# Patient Record
Sex: Female | Born: 1985 | Race: Black or African American | Hispanic: No | Marital: Married
Health system: Midwestern US, Community
[De-identification: ages and names within clinical notes are randomized; demographics above are authoritative.]

## PROBLEM LIST (undated history)

## (undated) DIAGNOSIS — K210 Gastro-esophageal reflux disease with esophagitis: Secondary | ICD-10-CM

## (undated) DIAGNOSIS — K219 Gastro-esophageal reflux disease without esophagitis: Secondary | ICD-10-CM

---

## 2016-06-29 ENCOUNTER — Emergency Department: Admit: 2016-06-29 | Payer: MEDICAID | Primary: Adolescent Medicine

## 2016-06-29 ENCOUNTER — Inpatient Hospital Stay
Admit: 2016-06-29 | Discharge: 2016-06-29 | Disposition: A | Discharge status: Home / Self Care | Payer: MEDICAID | Attending: Emergency Medicine

## 2016-06-29 DIAGNOSIS — M25531 Pain in right wrist: Secondary | ICD-10-CM

## 2016-06-29 MED ORDER — METHOCARBAMOL 750 MG TAB
750 mg | ORAL_TABLET | Freq: Four times a day (QID) | ORAL | 0 refills | Status: DC
Start: 2016-06-29 — End: 2016-09-03

## 2016-06-29 MED ORDER — IBUPROFEN 600 MG TAB
600 mg | ORAL_TABLET | Freq: Four times a day (QID) | ORAL | 0 refills | Status: DC | PRN
Start: 2016-06-29 — End: 2016-09-20
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
## 2016-06-29 NOTE — ED Notes (Signed)
Pt  Ambulating  To  Xray  At  This  Time
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
## 2016-06-29 NOTE — ED Notes (Signed)
Discharged  Home  At  This  Time

## 2016-06-29 NOTE — ED Triage Notes (Signed)
Patient complains of right wrist and shoulder pain.

## 2016-09-03 DIAGNOSIS — B349 Viral infection, unspecified: Secondary | ICD-10-CM

## 2016-09-03 NOTE — ED Triage Notes (Signed)
Reports chills and fatigue since Sunday. States today she felt hot all over and like she was going to pass out so she called the ambulance. Reports she has a doctor appointment tomorrow but she didn't want to wait. Pt also states she did a lot of errands today and hasn't eaten.

## 2016-09-03 NOTE — ED Provider Notes (Signed)
Patient is a 31 y.o. female presenting with chills. The history is provided by the patient.   Chills    This is a new problem. The current episode started more than 2 days ago. The problem has been gradually worsening. Patient reports a subjective fever - was not measured.Associated symptoms include diarrhea and cough. Pertinent negatives include no chest pain, no sleepiness, no vomiting, no headaches, no sore throat, no shortness of breath, no neck pain and no rash. She has tried nothing for the symptoms.        Past Medical History:   Diagnosis Date   ??? Psychiatric disorder     depression       Past Surgical History:   Procedure Laterality Date   ??? HX GYN      c-section x 2         History reviewed. No pertinent family history.    Social History     Social History   ??? Marital status: MARRIED     Spouse name: N/A   ??? Number of children: N/A   ??? Years of education: N/A     Occupational History   ??? Not on file.     Social History Main Topics   ??? Smoking status: Current Every Day Smoker   ??? Smokeless tobacco: Not on file   ??? Alcohol use No   ??? Drug use: No   ??? Sexual activity: Not on file     Other Topics Concern   ??? Not on file     Social History Narrative         ALLERGIES: Clindamycin    Review of Systems   Constitutional: Positive for chills. Negative for fever.   HENT: Negative for sore throat.    Eyes: Negative for discharge, redness and itching.   Respiratory: Positive for cough. Negative for shortness of breath.    Cardiovascular: Negative for chest pain and palpitations.   Gastrointestinal: Positive for diarrhea. Negative for abdominal pain, nausea and vomiting.   Genitourinary: Negative for dysuria and hematuria.   Musculoskeletal: Negative for back pain, neck pain and neck stiffness.   Skin: Negative for pallor and rash.   Neurological: Negative for dizziness, syncope, light-headedness and headaches.       Vitals:    09/03/16 2319   BP: 128/77   Pulse: 96   Resp: 15   Temp: 97 ??F (36.1 ??C)   SpO2: 99%    Weight: 101.2 kg (223 lb)   Height: '5\' 8"'  (1.727 m)            Physical Exam   Constitutional: She is oriented to person, place, and time. She appears well-developed and well-nourished. No distress.   HENT:   Head: Normocephalic and atraumatic.   Eyes: Conjunctivae and EOM are normal. Pupils are equal, round, and reactive to light. Right eye exhibits no discharge. Left eye exhibits no discharge. No scleral icterus.   Neck: Normal range of motion. Neck supple. No JVD present. No tracheal deviation present.   Cardiovascular: Normal rate, regular rhythm, normal heart sounds and intact distal pulses.  Exam reveals no gallop and no friction rub.    No murmur heard.  Pulmonary/Chest: Effort normal and breath sounds normal. No stridor. No respiratory distress. She has no wheezes. She has no rales.   Abdominal: Soft. Bowel sounds are normal. She exhibits no distension. There is no tenderness. There is no rebound.   Musculoskeletal: Normal range of motion. She exhibits no edema or tenderness.  Lymphadenopathy:     She has no cervical adenopathy.   Neurological: She is alert and oriented to person, place, and time. She has normal reflexes. No cranial nerve deficit. She exhibits normal muscle tone. Coordination normal.   Skin: Skin is warm. No rash noted. She is not diaphoretic. No erythema. No pallor.   Psychiatric: She has a normal mood and affect. Her behavior is normal. Judgment and thought content normal.   Nursing note and vitals reviewed.       MDM      ED Course       Procedures    Recent Results (from the past 24 hour(s))   EKG, 12 LEAD, INITIAL    Collection Time: 09/03/16 11:38 PM   Result Value Ref Range    Ventricular Rate 81 BPM    Atrial Rate 81 BPM    P-R Interval 160 ms    QRS Duration 84 ms    Q-T Interval 350 ms    QTC Calculation (Bezet) 406 ms    Calculated P Axis 35 degrees    Calculated R Axis 47 degrees    Calculated T Axis 34 degrees    Diagnosis       Normal sinus rhythm  Normal ECG   No previous ECGs available     CBC WITH AUTOMATED DIFF    Collection Time: 09/03/16 11:45 PM   Result Value Ref Range    WBC 11.6 (H) 4.8 - 11.0 K/uL    RBC 4.69 4.20 - 5.40 M/uL    HGB 14.6 12.0 - 16.0 g/dL    HCT 42.2 36.0 - 48.0 %    MCV 90.0 80.0 - 100.0 FL    MCH 31.1 (H) 27.0 - 31.0 PG    MCHC 34.6 31.0 - 37.0 g/dL    RDW 13.2 11.5 - 14.0 %    PLATELET 229 130 - 400 K/uL    MPV 10.1 (H) 6.5 - 9.5 FL    NEUTROPHILS 80 (H) 42.2 - 75.2 %    LYMPHOCYTES 11 (L) 20.5 - 51.1 %    MONOCYTES 8 0 - 12.0 %    EOSINOPHILS 1 0 - 7.0 %    BASOPHILS 0 0 - 2.5 %    ABS. NEUTROPHILS 9.1 (H) 2.0 - 8.1 K/UL    ABS. LYMPHOCYTES 1.3 1.3 - 3.4 K/UL    ABS. MONOCYTES 0.9 0.1 - 1.0 K/UL    ABS. EOSINOPHILS 0.2 0.0 - 0.2 K/UL    ABS. BASOPHILS 0.0 0.0 - 0.1 K/UL    DF AUTOMATED     METABOLIC PANEL, COMPREHENSIVE    Collection Time: 09/03/16 11:45 PM   Result Value Ref Range    Sodium 140 136 - 145 mmol/L    Potassium 3.6 3.5 - 5.1 mmol/L    Chloride 103 98 - 107 mmol/L    CO2 25 21 - 32 mmol/L    Anion gap 12 4 - 12 mmol/L    Glucose 123 (H) 74 - 106 mg/dL    BUN 13 7 - 18 mg/dL    Creatinine 0.78 0.55 - 1.02 mg/dL    GFR est AA >60 >60 ml/min/1.13m    GFR est non-AA >60 >60 ml/min/1.767m   Calcium 8.5 8.5 - 10.1 mg/dL    Bilirubin, total PENDING mg/dL    ALT (SGPT) PENDING U/L    AST (SGOT) PENDING U/L    Alk. phosphatase PENDING 46 - 116 U/L    Protein, total PENDING g/dL  Albumin PENDING g/dL    Globulin PENDING g/dL    A-G Ratio PENDING     HCG QL SERUM    Collection Time: 09/03/16 11:45 PM   Result Value Ref Range    HCG, Ql. NEGATIVE  NEG     INFLUENZA A & B AG (RAPID TEST)    Collection Time: 09/03/16 11:45 PM   Result Value Ref Range    Influenza A Ag NEGATIVE  NEG      Influenza B Ag NEGATIVE  NEG      Method IMMUNOCHROMATOGRAPHIC MEMBRANE ASSAY      Comment Result: Negative for Influenza A and B     URINALYSIS W/ RFLX MICROSCOPIC    Collection Time: 09/03/16 11:50 PM   Result Value Ref Range    Color YELLOW       Appearance CLOUDY      Specific gravity >1.030     pH (UA) 6.0      Protein TRACE mg/dL    Glucose NEGATIVE  mg/dL    Ketone NEGATIVE  mg/dL    Bilirubin NEGATIVE       Blood SMALL      Urobilinogen 1.0 EU/dL    Nitrites NEGATIVE       Leukocyte Esterase NEGATIVE      URINE MICROSCOPIC    Collection Time: 09/03/16 11:50 PM   Result Value Ref Range    WBC 0-3 /hpf    RBC NONE NONE /hpf    Epithelial cells 5-10 /hpf    Bacteria FEW (A) NONE /hpf    Casts NONE NONE /lpf    Crystals, urine NONE NONE /LPF       <EMERGENCY DEPARTMENT CASE SUMMARY>    Impression/Differential Diagnosis: flu, UTI, viral syndrome    Plan: as per orders    ED Course: stable    Final Impression/Diagnosis: viral syndrome    Patient condition at time of disposition: stable for d/c home      I have reviewed the following home medications:    Prior to Admission medications    Medication Sig Start Date End Date Taking? Authorizing Provider   escitalopram oxalate (LEXAPRO) 10 mg tablet Take 10 mg by mouth daily.   Yes Phys Other, MD   nabumetone (RELAFEN) 750 mg tablet Take 750 mg by mouth two (2) times a day.   Yes Phys Other, MD   cyclobenzaprine (FLEXERIL) 5 mg tablet Take 5 mg by mouth.   Yes Phys Other, MD   ibuprofen (MOTRIN) 600 mg tablet Take 1 Tab by mouth every six (6) hours as needed for Pain. 06/29/16   Brantley Fling, PA         Rocky Crafts, MD

## 2016-09-04 ENCOUNTER — Inpatient Hospital Stay
Admit: 2016-09-04 | Discharge: 2016-09-04 | Disposition: A | Discharge status: Home / Self Care | Payer: MEDICAID | Attending: Emergency Medicine

## 2016-09-04 LAB — EKG 12-LEAD
Atrial Rate: 81 {beats}/min
Diagnosis: NORMAL
P Axis: 35 degrees
P-R Interval: 160 ms
Q-T Interval: 350 ms
QRS Duration: 84 ms
QTc Calculation (Bazett): 406 ms
R Axis: 47 degrees
T Axis: 34 degrees
Ventricular Rate: 81 {beats}/min

## 2016-09-04 LAB — URINE MICROSCOPIC

## 2016-09-04 LAB — URINALYSIS W/ RFLX MICROSCOPIC
Bilirubin: NEGATIVE
Glucose: NEGATIVE mg/dL
Ketone: NEGATIVE mg/dL
Leukocyte Esterase: NEGATIVE
Nitrites: NEGATIVE
Specific gravity: >1.03
Urobilinogen: 1 EU/dL
pH (UA): 6

## 2016-09-04 LAB — EKG, 12 LEAD, INITIAL
Atrial Rate: 81 {beats}/min
Calculated P Axis: 35 degrees
Calculated R Axis: 47 degrees
Calculated T Axis: 34 degrees
Diagnosis: NORMAL
P-R Interval: 160 ms
Q-T Interval: 350 ms
QRS Duration: 84 ms
QTC Calculation (Bezet): 406 ms
Ventricular Rate: 81 {beats}/min

## 2016-09-04 LAB — CBC WITH AUTOMATED DIFF
ABS. BASOPHILS: 0 10*3/uL (ref 0.0–0.1)
ABS. EOSINOPHILS: 0.2 10*3/uL (ref 0.0–0.2)
ABS. LYMPHOCYTES: 1.3 10*3/uL (ref 1.3–3.4)
ABS. MONOCYTES: 0.9 10*3/uL (ref 0.1–1.0)
ABS. NEUTROPHILS: 9.1 10*3/uL — ABNORMAL HIGH (ref 2.0–8.1)
BASOPHILS: 0 % (ref 0–2.5)
EOSINOPHILS: 1 % (ref 0–7.0)
HCT: 42.2 % (ref 36.0–48.0)
HGB: 14.6 g/dL (ref 12.0–16.0)
LYMPHOCYTES: 11 % — ABNORMAL LOW (ref 20.5–51.1)
MCH: 31.1 PG — ABNORMAL HIGH (ref 27.0–31.0)
MCHC: 34.6 g/dL (ref 31.0–37.0)
MCV: 90 FL (ref 80.0–100.0)
MONOCYTES: 8 % (ref 0–12.0)
MPV: 10.1 FL — ABNORMAL HIGH (ref 6.5–9.5)
NEUTROPHILS: 80 % — ABNORMAL HIGH (ref 42.2–75.2)
PLATELET: 229 10*3/uL (ref 130–400)
RBC: 4.69 M/uL (ref 4.20–5.40)
RDW: 13.2 % (ref 11.5–14.0)
WBC: 11.6 10*3/uL — ABNORMAL HIGH (ref 4.8–11.0)

## 2016-09-04 LAB — INFLUENZA A & B AG (RAPID TEST)
Comment: NEGATIVE
Influenza A Ag: NEGATIVE
Influenza B Ag: NEGATIVE

## 2016-09-04 LAB — METABOLIC PANEL, COMPREHENSIVE
A-G Ratio: 1.1 (ref 1.0–1.5)
ALT (SGPT): 46 U/L (ref 12–78)
AST (SGOT): 22 U/L (ref 15–37)
Albumin: 3.9 g/dL (ref 3.4–5.0)
Alk. phosphatase: 77 U/L (ref 46–116)
Anion gap: 12 mmol/L (ref 4–12)
BUN: 13 mg/dL (ref 7–18)
Bilirubin, total: 0.5 mg/dL (ref 0.2–1.0)
CO2: 25 mmol/L (ref 21–32)
Calcium: 8.5 mg/dL (ref 8.5–10.1)
Chloride: 103 mmol/L (ref 98–107)
Creatinine: 0.78 mg/dL (ref 0.55–1.02)
GFR est AA: >60 mL/min/{1.73_m2} (ref >60)
GFR est non-AA: >60 mL/min/{1.73_m2} (ref >60)
Globulin: 3.5 g/dL (ref 2.8–3.9)
Glucose: 123 mg/dL — ABNORMAL HIGH (ref 74–106)
Potassium: 3.6 mmol/L (ref 3.5–5.1)
Protein, total: 7.4 g/dL (ref 6.4–8.2)
Sodium: 140 mmol/L (ref 136–145)

## 2016-09-04 LAB — HCG QL SERUM: HCG, Ql.: NEGATIVE

## 2016-09-04 NOTE — ED Notes (Signed)
Discharge instructions reviewed with positive understanding noted. Ambulated from ER without complaint.

## 2016-09-08 ENCOUNTER — Inpatient Hospital Stay
Admit: 2016-09-08 | Discharge: 2016-09-08 | Disposition: A | Discharge status: Home / Self Care | Payer: MEDICAID | Attending: Emergency Medicine

## 2016-09-08 DIAGNOSIS — F418 Other specified anxiety disorders: Secondary | ICD-10-CM

## 2016-09-08 LAB — METABOLIC PANEL, COMPREHENSIVE
A-G Ratio: 1.1 (ref 1.0–1.5)
ALT (SGPT): 51 U/L (ref 12–78)
AST (SGOT): 23 U/L (ref 15–37)
Albumin: 3.8 g/dL (ref 3.4–5.0)
Alk. phosphatase: 86 U/L (ref 46–116)
Anion gap: 5 mmol/L (ref 4–12)
BUN: 9 mg/dL (ref 7–18)
Bilirubin, total: 0.3 mg/dL (ref 0.2–1.0)
CO2: 31 mmol/L (ref 21–32)
Calcium: 8.4 mg/dL — ABNORMAL LOW (ref 8.5–10.1)
Chloride: 102 mmol/L (ref 98–107)
Creatinine: 0.92 mg/dL (ref 0.55–1.02)
GFR est AA: >60 mL/min/{1.73_m2} (ref >60)
GFR est non-AA: >60 mL/min/{1.73_m2} (ref >60)
Globulin: 3.6 g/dL (ref 2.8–3.9)
Glucose: 149 mg/dL — ABNORMAL HIGH (ref 74–106)
Potassium: 3.8 mmol/L (ref 3.5–5.1)
Protein, total: 7.4 g/dL (ref 6.4–8.2)
Sodium: 138 mmol/L (ref 136–145)

## 2016-09-08 LAB — CBC WITH AUTOMATED DIFF
ABS. BASOPHILS: 0 10*3/uL (ref 0.0–0.1)
ABS. EOSINOPHILS: 0.3 10*3/uL — ABNORMAL HIGH (ref 0.0–0.2)
ABS. LYMPHOCYTES: 1.4 10*3/uL (ref 1.3–3.4)
ABS. MONOCYTES: 0.5 10*3/uL (ref 0.1–1.0)
ABS. NEUTROPHILS: 4.6 10*3/uL (ref 2.0–8.1)
BASOPHILS: 0 % (ref 0.0–2.5)
EOSINOPHILS: 4 % (ref 0.0–7.0)
HCT: 41.4 % (ref 36.0–48.0)
HGB: 14.4 g/dL (ref 12.0–16.0)
LYMPHOCYTES: 21 % (ref 20.5–51.1)
MCH: 31.4 PG — ABNORMAL HIGH (ref 27.0–31.0)
MCHC: 34.8 g/dL (ref 31.0–37.0)
MCV: 90.2 FL (ref 80.0–100.0)
MONOCYTES: 7 % (ref 0.0–12.0)
MPV: 9.4 FL (ref 6.5–9.5)
NEUTROPHILS: 68 % (ref 42.2–75.2)
PLATELET: 223 10*3/uL (ref 130–400)
RBC: 4.59 M/uL (ref 4.20–5.40)
RDW: 13.1 % (ref 11.5–14.0)
WBC: 6.8 10*3/uL (ref 4.8–11.0)

## 2016-09-08 LAB — URINALYSIS W/ RFLX MICROSCOPIC
Bilirubin: NEGATIVE
Blood: NEGATIVE
Glucose: NEGATIVE mg/dL
Ketone: NEGATIVE mg/dL
Leukocyte Esterase: NEGATIVE
Nitrites: NEGATIVE
Protein: NEGATIVE mg/dL
Specific gravity: 1.015
Urobilinogen: 1 EU/dL
pH (UA): 7.5

## 2016-09-08 LAB — DRUG SCREEN, URINE
AMPHETAMINES: NEGATIVE
BARBITURATES: NEGATIVE
BENZODIAZEPINES: NEGATIVE
COCAINE: NEGATIVE
METHADONE: NEGATIVE
Methamphetamines: NEGATIVE
OPIATES: NEGATIVE
THC (TH-CANNABINOL): POSITIVE — AB
TRICYCLICS: NEGATIVE

## 2016-09-08 LAB — ETHYL ALCOHOL: ETHYL ALCOHOL, SERUM: <5 MG/DL (ref <5)

## 2016-09-08 LAB — HCG URINE, QL: HCG urine, QL: NEGATIVE

## 2016-09-08 MED ORDER — ALPRAZOLAM 0.25 MG TAB
0.25 mg | ORAL_TABLET | Freq: Three times a day (TID) | ORAL | 0 refills | Status: DC | PRN
Start: 2016-09-08 — End: 2016-11-02

## 2016-09-08 MED ORDER — ALPRAZOLAM 0.25 MG TAB
0.25 mg | ORAL_TABLET | Freq: Three times a day (TID) | ORAL | 0 refills | Status: DC | PRN
Start: 2016-09-08 — End: 2016-09-08

## 2016-09-08 NOTE — ED Notes (Signed)
Per soc worker pt does not meet criteria for in patient care  Pt eating bag lunch at this time  Pt resting comfortably at this time

## 2016-09-08 NOTE — ED Triage Notes (Signed)
Requesting mental health eval , feeling depressed , has had a lot of life stressors , denies suicidal ideations , does follow with psych md and counselor , pt has apt feb 22 with md for possible med adjustment , needs to talk with someone

## 2016-09-08 NOTE — ED Notes (Signed)
Received pt in room 7 cooperative  Pt provided urine spec  Dr Tonia Ghenthmoud in to see pt and discussed plan of care, pt verbalized understanding  Pt verbalized the need to talk to someone and hopefully receive recommendations on where to get help   Pt states she is under a lot of stress and feeling overwhelmed  Pt states she moved to [place] about 90 days ago from [place] and just has a lot on her plate   Pt denies SI and HI  Pt cooperative

## 2016-09-08 NOTE — Progress Notes (Signed)
BEHAVIORAL HEALTH DISCHARGE INSTRUCTIONS/REFERRALS:      Toribio Harbourashawn Calder            [address]  [place] [id number]           [phone] (home)   DOB: 08-16-1985               Payor/Plan Subscr DOB Sex Relation Sub. Ins. ID Effective Group Num   1. [employer] Sonny Masters* Ligas,Kashawna 08-16-1985 Female Self [id number] 02/25/16 [place]                                   [address]       There is no problem list on file for this patient.      Patient will be discharged per Dr. Cheri RousGulati       Was the patient referred to Independent Living Peer Diversion?  Not applicable    Was the patient referred to crisis residential/respite?  Not applicable    Will [facility] contact patient for well check visit?  Not applicable    May we call you after discharge (if Yes, give the best number)?   Not applicable  May we leave a message on your phone?  Not applicable    Consents signed:  n/a    Discharge Instructions, Appointments, and Referrals:  Emergency room social worker consulted with psychiatrist, Dr. Cheri RousGulati. Per Dr. Cheri RousGulati, the patient is not meeting criteria for emergency mental health admission. Patient is denying suicidal/homicidal ideations and auditory/visual hallucinations. Per Dr. Cheri RousGulati, the patient is encouraged to follow up with her primary care physician, Dr. Lillette Boxerival on 09/11/16, along [facility]. If patient feels she needs to be reassessed for mental health between now and then, she can return to the emergency room or she call [facility] at [phone]. Mental health referrals were provided to the patient as well.       FOR RESOURCES NOT LISTED OR ADDITIONAL SUPPORTS PLEASE CALL [facility] [phone].    PATIENT ACKNOWLEDGEMENT: I hereby acknowledge that I have read the above instructions (or have had them read to me).  The instructions were explained to me and I was given the opportunity to ask questions.  I  acknowledge that I understand the instructions.  I have received a copy of these instructions.        ______________________________________   09/08/2016  Screener's Signature        ______________________________________   09/08/2016  Signature of Patient or Representative        * If you feel that you are at risk of harming yourself please call:  National Suicide Prevention Lifeline    800-273-TALK   or one of the following:    EMERGENCY NUMBERS  [facility]          [phone]  [place] [facility]                                 [phone]  [place] [facility]                            [phone]  [place] [facility]  [place]   [phone]  [facility]   [phone] Response Team  [phone]   [employer] (for teens offered Mon-Thurs 4pm-10pm   Friday 4pm-12am, Sat. 5pm -12am)  [employer] (peer to peer for youth) [phone]-[phone])

## 2016-09-08 NOTE — ED Provider Notes (Signed)
Patient is a 31 y.o. female presenting with mental health disorder. The history is provided by the patient.   Mental Health Problem   The primary symptoms include dysphoric mood. The current episode started more than 1 month ago.   The dysphoric mood began more than 2 weeks ago. The mood has been worsening since its onset. The mood includes feelings of sadness, despair and irritability. Her change in mood was precipitated by the death of a loved one.   The onset of the illness is precipitated by a stressful event. Additional symptoms of the illness include feelings of worthlessness. Additional symptoms of the illness do not include anhedonia, insomnia, poor judgment, headaches or abdominal pain. She does not admit to suicidal ideas. She does not have a plan to commit suicide. She does not contemplate harming herself. She has not already injured self. She does not contemplate injuring another person. She has not already  injured another person. Risk factors that are present for mental illness include a history of mental illness.        Past Medical History:   Diagnosis Date   ??? Anxiety    ??? Depression    ??? Psychiatric disorder     depression   ??? PTSD (post-traumatic stress disorder)        Past Surgical History:   Procedure Laterality Date   ??? HX GYN      c-section x 2         History reviewed. No pertinent family history.    Social History     Social History   ??? Marital status: MARRIED     Spouse name: N/A   ??? Number of children: N/A   ??? Years of education: N/A     Occupational History   ??? Not on file.     Social History Main Topics   ??? Smoking status: Current Every Day Smoker   ??? Smokeless tobacco: Never Used   ??? Alcohol use Yes      Comment: occasional   ??? Drug use: No   ??? Sexual activity: Not on file     Other Topics Concern   ??? Not on file     Social History Narrative         ALLERGIES: Clindamycin    Review of Systems   Constitutional: Negative for chills and fever.   HENT: Negative for sore throat.     Eyes: Negative for discharge, redness and itching.   Respiratory: Negative for cough and shortness of breath.    Cardiovascular: Negative for chest pain and palpitations.   Gastrointestinal: Negative for abdominal pain, diarrhea, nausea and vomiting.   Genitourinary: Negative for dysuria and hematuria.   Musculoskeletal: Negative for back pain and neck stiffness.   Skin: Negative for pallor.   Neurological: Negative for dizziness, syncope, light-headedness and headaches.   Psychiatric/Behavioral: Positive for dysphoric mood. The patient does not have insomnia.        Vitals:    09/08/16 0835   BP: 142/87   Pulse: (!) 110   Resp: 16   Temp: 97 ??F (36.1 ??C)   SpO2: 98%   Weight: 104.3 kg (230 lb)   Height: '5\' 8"'  (1.727 m)            Physical Exam   Constitutional: She is oriented to person, place, and time. She appears well-developed and well-nourished. No distress.   HENT:   Head: Normocephalic and atraumatic.   Eyes: Conjunctivae and EOM are normal. Pupils  are equal, round, and reactive to light. Right eye exhibits no discharge. Left eye exhibits no discharge. No scleral icterus.   Neck: Normal range of motion. Neck supple. No JVD present. No tracheal deviation present.   Cardiovascular: Normal rate, regular rhythm, normal heart sounds and intact distal pulses.  Exam reveals no gallop and no friction rub.    No murmur heard.  Pulmonary/Chest: Effort normal and breath sounds normal. No stridor. No respiratory distress. She has no wheezes. She has no rales.   Abdominal: Soft. Bowel sounds are normal. She exhibits no distension. There is no tenderness.   Musculoskeletal: Normal range of motion. She exhibits no edema or tenderness.   Lymphadenopathy:     She has no cervical adenopathy.   Neurological: She is alert and oriented to person, place, and time. She has normal reflexes. No cranial nerve deficit. She exhibits normal muscle tone. Coordination normal.    Skin: Skin is warm. No rash noted. She is not diaphoretic. No erythema. No pallor.   Psychiatric: She has a normal mood and affect. Her behavior is normal. Judgment and thought content normal.   Nursing note and vitals reviewed.       MDM      ED Course       Procedures    Recent Results (from the past 24 hour(s))   URINALYSIS W/ RFLX MICROSCOPIC    Collection Time: 09/08/16  8:46 AM   Result Value Ref Range    Color YELLOW      Appearance CLEAR      Specific gravity 1.015      pH (UA) 7.5      Protein NEGATIVE  mg/dL    Glucose NEGATIVE  mg/dL    Ketone NEGATIVE  mg/dL    Bilirubin NEGATIVE       Blood NEGATIVE       Urobilinogen 1.0 EU/dL    Nitrites NEGATIVE       Leukocyte Esterase NEGATIVE      DRUG SCREEN, URINE    Collection Time: 09/08/16  8:46 AM   Result Value Ref Range    AMPHETAMINES NEGATIVE  NEG      Methamphetamines NEGATIVE  NEG      BARBITURATES NEGATIVE  NEG      BENZODIAZEPINES NEGATIVE  NEG      COCAINE NEGATIVE  NEG      METHADONE NEGATIVE  NEG      OPIATES NEGATIVE  NEG      PCP(PHENCYCLIDINE) BACKUP METHOD USED. DRUG NOT INCLUDED ON PANELS. (A) NEG      THC (TH-CANNABINOL) Presumptive Positive (A) NEG      TRICYCLICS NEGATIVE  NEG     HCG URINE, QL    Collection Time: 09/08/16  8:46 AM   Result Value Ref Range    HCG urine, QL NEGATIVE  NEG     METABOLIC PANEL, COMPREHENSIVE    Collection Time: 09/08/16  8:55 AM   Result Value Ref Range    Sodium 138 136 - 145 mmol/L    Potassium 3.8 3.5 - 5.1 mmol/L    Chloride 102 98 - 107 mmol/L    CO2 31 21 - 32 mmol/L    Anion gap 5 4 - 12 mmol/L    Glucose 149 (H) 74 - 106 mg/dL    BUN 9 7 - 18 mg/dL    Creatinine 0.92 0.55 - 1.02 mg/dL    GFR est AA >60 >60 ml/min/1.56m    GFR est non-AA >  60 >60 ml/min/1.49m    Calcium 8.4 (L) 8.5 - 10.1 mg/dL    Bilirubin, total 0.3 0.2 - 1.0 mg/dL    ALT (SGPT) 51 12 - 78 U/L    AST (SGOT) 23 15 - 37 U/L    Alk. phosphatase 86 46 - 116 U/L    Protein, total 7.4 6.4 - 8.2 g/dL    Albumin 3.8 3.4 - 5.0 g/dL     Globulin 3.6 2.8 - 3.9 g/dL    A-G Ratio 1.1 1.0 - 1.5     CBC WITH AUTOMATED DIFF    Collection Time: 09/08/16  8:55 AM   Result Value Ref Range    WBC 6.8 4.8 - 11.0 K/uL    RBC 4.59 4.20 - 5.40 M/uL    HGB 14.4 12.0 - 16.0 g/dL    HCT 41.4 36.0 - 48.0 %    MCV 90.2 80.0 - 100.0 FL    MCH 31.4 (H) 27.0 - 31.0 PG    MCHC 34.8 31.0 - 37.0 g/dL    RDW 13.1 11.5 - 14.0 %    PLATELET 223 130 - 400 K/uL    MPV 9.4 6.5 - 9.5 FL    NEUTROPHILS 68 42.2 - 75.2 %    LYMPHOCYTES 21 20.5 - 51.1 %    MONOCYTES 7 0.0 - 12.0 %    EOSINOPHILS 4 0.0 - 7.0 %    BASOPHILS 0 0.0 - 2.5 %    ABS. NEUTROPHILS 4.6 2.0 - 8.1 K/UL    ABS. LYMPHOCYTES 1.4 1.3 - 3.4 K/UL    ABS. MONOCYTES 0.5 0.1 - 1.0 K/UL    ABS. EOSINOPHILS 0.3 (H) 0.0 - 0.2 K/UL    ABS. BASOPHILS 0.0 0.0 - 0.1 K/UL    DF AUTOMATED     ETHYL ALCOHOL    Collection Time: 09/08/16  8:55 AM   Result Value Ref Range    ETHYL ALCOHOL, SERUM <5.0 <5 MG/DL       Medically cleared for mental health eval

## 2016-09-08 NOTE — ED Notes (Signed)
Not meeting inpt criteria , script sent to pharm, pt aware , pt amb off uni t
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
## 2016-09-08 NOTE — ED Notes (Signed)
Soc worker Theresa in talking w/pt at this time

## 2016-09-20 ENCOUNTER — Emergency Department: Admit: 2016-09-20 | Payer: MEDICAID | Primary: Adolescent Medicine

## 2016-09-20 ENCOUNTER — Inpatient Hospital Stay
Admit: 2016-09-20 | Discharge: 2016-09-21 | Disposition: A | Discharge status: Other Acute Inpatient Hospital | Payer: MEDICAID | Attending: Emergency Medicine

## 2016-09-20 DIAGNOSIS — T424X2A Poisoning by benzodiazepines, intentional self-harm, initial encounter: Secondary | ICD-10-CM

## 2016-09-20 LAB — DRUG SCREEN, URINE
AMPHETAMINES: NEGATIVE
BARBITURATES: NEGATIVE
BENZODIAZEPINES: POSITIVE — AB
COCAINE: NEGATIVE
METHADONE: NEGATIVE
Methamphetamines: NEGATIVE
OPIATES: NEGATIVE
THC (TH-CANNABINOL): POSITIVE — AB
TRICYCLICS: NEGATIVE

## 2016-09-20 LAB — CBC WITH AUTOMATED DIFF
ABS. BASOPHILS: 0 10*3/uL (ref 0.0–0.1)
ABS. EOSINOPHILS: 0.3 10*3/uL — ABNORMAL HIGH (ref 0.0–0.2)
ABS. LYMPHOCYTES: 1.3 10*3/uL (ref 1.3–3.4)
ABS. MONOCYTES: 0.5 10*3/uL (ref 0.1–1.0)
ABS. NEUTROPHILS: 3.2 10*3/uL (ref 2.0–8.1)
BASOPHILS: 1 % (ref 0.0–2.5)
EOSINOPHILS: 6 % (ref 0.0–7.0)
HCT: 41.9 % (ref 36.0–48.0)
HGB: 14.3 g/dL (ref 12.0–16.0)
LYMPHOCYTES: 24 % (ref 20.5–51.1)
MCH: 31 PG (ref 27.0–31.0)
MCHC: 34.1 g/dL (ref 31.0–37.0)
MCV: 90.9 FL (ref 80.0–100.0)
MONOCYTES: 10 % (ref 0.0–12.0)
MPV: 9.5 FL (ref 6.5–9.5)
NEUTROPHILS: 59 % (ref 42.2–75.2)
PLATELET: 204 10*3/uL (ref 130–400)
RBC: 4.61 M/uL (ref 4.20–5.40)
RDW: 13.6 % (ref 11.5–14.0)
WBC: 5.4 10*3/uL (ref 4.8–11.0)

## 2016-09-20 LAB — URINE MICROSCOPIC

## 2016-09-20 LAB — URINALYSIS W/ RFLX MICROSCOPIC
Bilirubin: NEGATIVE
Glucose: NEGATIVE mg/dL
Ketone: NEGATIVE mg/dL
Leukocyte Esterase: NEGATIVE
Nitrites: NEGATIVE
Protein: NEGATIVE mg/dL
Specific gravity: 1.015
Urobilinogen: 1 EU/dL
pH (UA): 8

## 2016-09-20 LAB — METABOLIC PANEL, COMPREHENSIVE
A-G Ratio: 1.1 (ref 1.0–1.5)
ALT (SGPT): 62 U/L (ref 12–78)
AST (SGOT): 38 U/L — ABNORMAL HIGH (ref 15–37)
Albumin: 3.9 g/dL (ref 3.4–5.0)
Alk. phosphatase: 69 U/L (ref 46–116)
Anion gap: 9 mmol/L (ref 4–12)
BUN: 7 mg/dL (ref 7–18)
Bilirubin, total: 1.2 mg/dL — ABNORMAL HIGH (ref 0.2–1.0)
CO2: 28 mmol/L (ref 21–32)
Calcium: 8.7 mg/dL (ref 8.5–10.1)
Chloride: 106 mmol/L (ref 98–107)
Creatinine: 0.74 mg/dL (ref 0.55–1.02)
GFR est AA: >60 mL/min/{1.73_m2} (ref >60)
GFR est non-AA: >60 mL/min/{1.73_m2} (ref >60)
Globulin: 3.7 g/dL (ref 2.8–3.9)
Glucose: 107 mg/dL — ABNORMAL HIGH (ref 74–106)
Potassium: 3.7 mmol/L (ref 3.5–5.1)
Protein, total: 7.6 g/dL (ref 6.4–8.2)
Sodium: 143 mmol/L (ref 136–145)

## 2016-09-20 LAB — ACETAMINOPHEN: Acetaminophen level: <2 ug/mL — ABNORMAL LOW (ref 10–30)

## 2016-09-20 LAB — SALICYLATE: Salicylate level: <2.8 MG/DL — ABNORMAL LOW (ref 2.8–20.0)

## 2016-09-20 LAB — HCG QL SERUM: HCG, Ql.: NEGATIVE

## 2016-09-20 LAB — ETHYL ALCOHOL: ETHYL ALCOHOL, SERUM: <5 MG/DL (ref <5)

## 2016-09-20 MED ORDER — ACTIVATED CHARCOAL 25 GRAM/120 ML ORAL SUSP
25 gram/120 mL | ORAL | Status: AC
Start: 2016-09-20 — End: 2016-09-20
  Administered 2016-09-20: 17:00:00 via ORAL

## 2016-09-20 MED ORDER — SODIUM CHLORIDE 0.9% BOLUS IV
0.9 % | Freq: Once | INTRAVENOUS | Status: AC
Start: 2016-09-20 — End: 2016-09-20
  Administered 2016-09-20: 17:00:00 via INTRAVENOUS

## 2016-09-20 MED FILL — ACTIDOSE-AQUA 25 GRAM/120 ML ORAL SUSPENSION: 25 gram/120 mL | ORAL | Qty: 240

## 2016-09-20 MED FILL — SODIUM CHLORIDE 0.9 % IV: INTRAVENOUS | Qty: 1000

## 2016-09-20 NOTE — ED Notes (Signed)
Soc Worker in talking with patient at this time  Pt cooperative

## 2016-09-20 NOTE — ED Notes (Signed)
Bedside report given to RN Mary Lou.  Patient care and plan reviewed with RN assuming primary care.  Patient provided with update.

## 2016-09-20 NOTE — Progress Notes (Addendum)
SW/Screener Note Update:    SW called the following  hospitals:    [facility]    [facility], Per Lupita Leashonna fax over, Dr. Sharee PimpleWynn will review tomorrow     [facility] - No Beds per Windy Fastonna    [facility] - No Beds per Abram Sanderominic    [facility] - No Beds, per Veatrice KellsYanira    [facility] - No Beds, per Joni ReiningNicole, will hold for tomorrow    [facility] - fax over referral per [facility]    [facility] - No Beds, per [facility]    [facility] - No [place], per ConocoPhillipsChristina    [facility] - No Beds, per [facility]    [facility] - Beds- referral faxed over  NP Abe PeopleLinda Minetti accepted patient  Attending Dr. [facility]    [facility] tax ID# [id number]  NPI# [id number]  Address: [address], [place], [place] [address]  [facility] Unit -  [phone]  UR Contact: Jiles CrockerShelly Simmons  [phone]  Fax [phone]    Authorization obtained from [employer] after hours care manager Maribel  For 1-day, effective 09/20/16-09/21/16 5 pm.  Pre Authorization# [id number]

## 2016-09-20 NOTE — ED Notes (Signed)
Pt resting eyes closed at this time  resp equal and unlabored

## 2016-09-20 NOTE — ED Notes (Signed)
Bedside ekg in progress

## 2016-09-20 NOTE — Other (Signed)
TRANSFER - OUT REPORT:    Verbal report given to [facility] @ [facility] [place] on [place]awn Garza  being transferred to Mental Health for routine progression of care       Report consisted of patient???s Situation, Background, Assessment and   Recommendations(SBAR).     Information from the following report(s) SBAR, ED Summary and MAR was reviewed with the receiving nurse.    Lines:       Opportunity for questions and clarification was provided.      Patient transported with:   [facility]  BLS   2230 Estimated time of P/U

## 2016-09-20 NOTE — ED Notes (Signed)
Soc Worker working on [facility] [place] and talked to L-3 CommunicationsLinda Manetti NP. Still waiting on finalization.

## 2016-09-20 NOTE — Progress Notes (Addendum)
[facility]  [place], [place] (30 miles)  [phone]  Fax: [phone]     []   Referral faxed/reviewing   [x]   No beds   []   Declined      Contact:  [facility], [place] ( )  [phone]. [phone]  Fax: [phone]     [x]   Referral faxed/reviewing   []   No beds   []   Declined     Contact:  Per Lupita Leash, fax over and Dr. Sharee Pimple will review tomorrow [facility]  [place], [place] (60 miles)  [phone]  Fax: [phone]       []   Referral faxed/reviewing   [x]   No beds   []   Declined     Contact:  [facility]  [place], [place] (60 miles)  [phone] Crisis Response)  [phone] (BHU)  [phone] (ER referrals)    Fax: [phone]     []   Referral faxed/reviewing   [x]   No beds   []   Declined     Contact:  [facility], [place] (65 miles)  [phone]  Fax: [phone]     []   Referral faxed/reviewing   [x]   No beds   []   Declined     Contact:  Veatrice Kells [facility] (not locked)  [place], [place] [address])  [phone]  Fax: [phone] /   [phone]     [x]   Referral faxed/reviewing   [x]   No beds   []   Declined     Contact:  Joni Reining - fax and will hold bed for Monday       [facility]. [place], [place] (70 miles)  [phone]  Fax: [phone]     []   Referral faxed/reviewing   [x]   No beds   []   Declined     Contact:  [facility]  [place], [place] (70 miles)  [phone]. [phone]  Fax: [phone]     []   Referral faxed/reviewing   [x]   No beds   []   Declined     Contact:  [facility], [place] (75 miles)  [phone]. [phone]  Fax: [phone]     []   Referral faxed/reviewing   [x]   No beds   []   Declined     Contact:  [facility]  [place], [place] (80 miles)  [phone]  Fax: [phone]     [x]   Referral faxed/reviewing   []   No beds   []   Declined     Contact:  Mardene Sayer???s [place], [place] (85 miles)   [phone]  Fax: [phone]     []   Referral faxed/reviewing   []   No beds   []   Declined     Contact:  [facility]  [place], [place]  [phone] - transfer to psyc ED  [phone])  Fax:  [phone]     []   Referral faxed/reviewing   []   No beds   []   Declined     Contact:       [facility] [place]  [place], [place] [phone] [place])  [phone]   Fax:  [phone]   ATTN:  Judeth Cornfield, unit coordinator  [phone] (partial hospitalization)     [  x]  Referral faxed/reviewing   []   No beds   []   Declined      Contact:  Abe PeopleLinda Minetti [facility]  [place], [place]  [phone]  Fax: [phone]     []   Referral faxed/reviewing   []   No beds   []   Declined     Contact:     [facility]  [place], [place]  [phone]. [phone]  Fax: [phone]     []   Referral faxed/reviewing   []   No beds   []   Declined     Contact:       [place] [facility]  [address] [place], [place] [address]  [phone]  (Geriatric Psych)   []   Referral faxed/reviewing   []   No beds   []   Declined     Contact:   [facility]  [place], [place]  [phone]   [phone]) [phone]  Fax: [phone]   []   Referral faxed/reviewing   []   No beds   []   Declined     Contact:      [facility]  [address]  [place], [place] [address]  [phone]  [facility] [phone]  Psych ED: [phone]  after [phone]  [facility] Admission Coordinator [phone]  (Geriatric Psych)     []   Referral faxed/reviewing   []   No beds   []   Declined     Contact:       [facility]   [address]  [place], [place] [address]  Phone: [phone]   []   Referral faxed/reviewing   []   No beds   []   Declined     Contact:     [facility]  [address]   [place], [place] [address]  [phone] (prompt 5)  (Geriatric Psych)     []   Referral faxed/reviewing   []   No beds   []   Declined      Contact:        [facility]  [address], [place], [place] [address]  [phone]   []   Referral faxed/reviewing   []   No beds   []   Declined     Contact:     [facility]  [place], [place]  Call Center: [phone]  Psychiatry  [phone]  Main Number: [phone]  (Geriatric Psych)   []   Referral faxed/reviewing   []   No beds   []   Declined     Contact:     [facility]  [place], [place]  [phone]  Fax: [phone]  (Geriatric Psych)   []   Referral faxed/reviewing   []   No beds   []   Declined     Contact:       [facility]  [address]   [place], [place] [address]  [phone]  Fax:  [phone]     []   Referral faxed/reviewing   []   No beds   []   Declined     Contact:   [place]    [phone]  Fax:  [phone]      []   Referral faxed/reviewing   []   No beds   []   Declined     Contact:

## 2016-09-20 NOTE — Progress Notes (Addendum)
SW/Screener Note Update:      [facility] -  NP Abe PeopleLinda Minetti accepted patient  Attending Dr. [facility]    [facility] tax ID# [id number]  NPI# [id number]  Address: [address], [place], [place] [address]  [facility] Unit -  [phone]  UR Contact: Jiles CrockerShelly Simmons  [phone]  Fax [phone]    Authorization obtained from [employer] after hours care manager Maribel  For 1-day, effective 09/20/16-09/21/16 5 pm.  Pre Authorization# [id number]      Information was provided to [facility]. Referral packet was received  Awaiting NP to confirm receipt and permission to arrange transportation

## 2016-09-20 NOTE — ED Notes (Signed)
Patient to be transferred with full ed chart. This includes nursing notes, provider history and exam, impression/plan, medication list , allergies, and results of studies which have resulted at the time of the chart printing.

## 2016-09-20 NOTE — ED Notes (Signed)
Patient scored >3 on SAD PERSONS screen. Patient placed in a secure environment. Patient changed into hospital attire and belongings removed, labeled, and secured by security. Security at bedside. Social Worker to be notified.

## 2016-09-20 NOTE — ED Notes (Signed)
Pt accepted per Soc Worker  Dr Elige Radonallyne aware

## 2016-09-20 NOTE — ED Notes (Signed)
Pt remains 1-1 security watch  Pt cooperative  Food tray ordered

## 2016-09-20 NOTE — ED Notes (Signed)
EMS to take patient to [facility].  Patient cooperative and left via stretcher.
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
## 2016-09-20 NOTE — ED Triage Notes (Signed)
Pt biba r/t call from husband to ems  Pt states she intentionally took 3 tramadol 50 mg and (7) 0.5mg  of xanax  This medication was taken at 1030 today  Pt mumbling she should have just done this by train "it was right there"  "I tried to keep this all quiet"  Pt cooperative  States everything is going on

## 2016-09-20 NOTE — ED Notes (Signed)
Pt waiting for a female bed to be available  No female beds avail here at this time  Soc worker making calls to other facilities for acceptance

## 2016-09-20 NOTE — ED Notes (Signed)
Pt provided w/lunch and ate 90%

## 2016-09-20 NOTE — ED Notes (Signed)
Portable cxr completed

## 2016-09-20 NOTE — ED Notes (Signed)
Pt remains cooperative, family in and out of room talking w/pt  Pt provided w/hot lunch and is enjoying it  Security maintained

## 2016-09-20 NOTE — ED Provider Notes (Addendum)
HPI Comments: The pt arrives to the ed by ambulance, fully awake and alert.  The pt is calm and cooperative,  States she has been very depressed since her father died,  States she took tramadol 50 mg x 3 and xanax 0.5 mg x 7 pills at approx 1030 today with the intent to kill herself.  There is no n/v.  States she smokes 1 ppd cigs and also smokes thc.  States she used "molly" last night.  Denies etoh use today.  The pt states that the meds she took are prescribed for her    Patient is a 31 y.o. female presenting with Ingested Medication. The history is provided by the patient.   Drug Overdose          Past Medical History:   Diagnosis Date   ??? Anxiety    ??? Depression    ??? Psychiatric disorder     depression   ??? PTSD (post-traumatic stress disorder)        Past Surgical History:   Procedure Laterality Date   ??? HX GYN      c-section x 2         History reviewed. No pertinent family history.    Social History     Social History   ??? Marital status: MARRIED     Spouse name: N/A   ??? Number of children: N/A   ??? Years of education: N/A     Occupational History   ??? Not on file.     Social History Main Topics   ??? Smoking status: Current Every Day Smoker   ??? Smokeless tobacco: Never Used   ??? Alcohol use Yes      Comment: occasional   ??? Drug use: No   ??? Sexual activity: Not on file     Other Topics Concern   ??? Not on file     Social History Narrative         ALLERGIES: Clindamycin    Review of Systems   Constitutional: Negative.    HENT: Negative.    Eyes: Negative.    Respiratory: Negative.    Cardiovascular: Negative.    Gastrointestinal: Positive for vomiting. Negative for nausea.   Genitourinary: Negative.    Musculoskeletal: Negative.    Skin: Negative.    Neurological: Negative.    Psychiatric/Behavioral: Positive for dysphoric mood and suicidal ideas.       Vitals:    09/20/16 1118   Pulse: 97   Resp: 17   Temp: 98.2 ??F (36.8 ??C)   SpO2: 97%   Weight: 104.3 kg (230 lb)   Height: '5\' 8"'  (1.727 m)            Physical Exam    Constitutional: She is oriented to person, place, and time. She appears well-developed and well-nourished. No distress.   HENT:   Head: Normocephalic and atraumatic.   Right Ear: External ear normal.   Left Ear: External ear normal.   Nose: Nose normal.   Mouth/Throat: Oropharynx is clear and moist.   Eyes: Conjunctivae and EOM are normal. Pupils are equal, round, and reactive to light.   Neck: Normal range of motion. Neck supple.   Cardiovascular: Regular rhythm, normal heart sounds and intact distal pulses.  Tachycardia present.    Pulmonary/Chest: Effort normal and breath sounds normal.   Abdominal: Soft. Bowel sounds are normal.   Musculoskeletal: Normal range of motion.   Neurological: She is alert and oriented to person, place, and time.  She has normal reflexes.   No focal neuro deficits   Skin: Skin is warm and dry.   Psychiatric: Her speech is normal and behavior is normal. Cognition and memory are normal. She expresses impulsivity and inappropriate judgment. She exhibits a depressed mood. She expresses suicidal ideation. She expresses suicidal plans (overodse).   Nursing note and vitals reviewed.       MDM  Number of Diagnoses or Management Options     Amount and/or Complexity of Data Reviewed  Clinical lab tests: reviewed and ordered  Tests in the radiology section of CPT??: ordered and reviewed  Tests in the medicine section of CPT??: reviewed and ordered  Independent visualization of images, tracings, or specimens: yes    Risk of Complications, Morbidity, and/or Mortality  Presenting problems: moderate  Diagnostic procedures: low  Management options: low    Critical Care  Total time providing critical care: < 30 minutes    Patient Progress  Patient progress: stable        ED Course       EKG  Date/Time: 09/20/2016 11:47 AM  Performed by: Ellin Goodie  Authorized by: Ellin Goodie     ECG reviewed by ED Physician in the absence of a cardiologist: yes    Previous ECG:     Previous ECG:  Unavailable  Rate:      ECG rate:  94    ECG rate assessment: normal    Rhythm:     Rhythm: sinus rhythm    Ectopy:     Ectopy: none    QRS:     QRS axis:  Normal    QRS intervals:  Normal  Conduction:     Conduction: normal    ST segments:     ST segments:  Normal  T waves:     T waves: normal        11:24 AM  Visit Vitals   ??? Pulse 97   ??? Temp 98.2 ??F (36.8 ??C)   ??? Resp 17   ??? Ht '5\' 8"'  (1.727 m)   ??? Wt 104.3 kg (230 lb)   ??? SpO2 97%   ??? BMI 34.97 kg/m2       12:09 PM  Xr Chest Port    Result Date: 09/20/2016  Exam: Chest          09/20/2016 11:31 AM Name:                 Age:31 years HISTORY:  Overdose COMPARISON : None. TECHNIQUE: AP view is submitted. FINDINGS:  The cardiac silhouette, mediastinal contour, and pulmonary vascularity are within normal limits.  No lung mass, parenchymal infiltrate, pneumothorax, or pleural effusion is seen.  The visualized bony structures are unremarkable.     IMPRESSION: No active pulmonary disease. THIS DOCUMENT HAS BEEN ELECTRONICALLY SIGNED BY EMMANUEL LLADO MD       12:54 PM  Results for orders placed or performed during the hospital encounter of 83/15/17   METABOLIC PANEL, COMPREHENSIVE   Result Value Ref Range    Sodium 143 136 - 145 mmol/L    Potassium 3.7 3.5 - 5.1 mmol/L    Chloride 106 98 - 107 mmol/L    CO2 28 21 - 32 mmol/L    Anion gap 9 4 - 12 mmol/L    Glucose 107 (H) 74 - 106 mg/dL    BUN 7 7 - 18 mg/dL    Creatinine 0.74 0.55 - 1.02 mg/dL    GFR est AA >  60 >60 ml/min/1.47m    GFR est non-AA >60 >60 ml/min/1.744m   Calcium 8.7 8.5 - 10.1 mg/dL    Bilirubin, total 1.2 (H) 0.2 - 1.0 mg/dL    ALT (SGPT) 62 12 - 78 U/L    AST (SGOT) 38 (H) 15 - 37 U/L    Alk. phosphatase 69 46 - 116 U/L    Protein, total 7.6 6.4 - 8.2 g/dL    Albumin 3.9 3.4 - 5.0 g/dL    Globulin 3.7 2.8 - 3.9 g/dL    A-G Ratio 1.1 1.0 - 1.5     ACETAMINOPHEN   Result Value Ref Range    Acetaminophen level <2 (L) 10 - 30 ug/mL   SALICYLATE   Result Value Ref Range    Salicylate level <2<5.4L) 2.8 - 20.0 MG/DL    CBC WITH AUTOMATED DIFF   Result Value Ref Range    WBC 5.4 4.8 - 11.0 K/uL    RBC 4.61 4.20 - 5.40 M/uL    HGB 14.3 12.0 - 16.0 g/dL    HCT 41.9 36.0 - 48.0 %    MCV 90.9 80.0 - 100.0 FL    MCH 31.0 27.0 - 31.0 PG    MCHC 34.1 31.0 - 37.0 g/dL    RDW 13.6 11.5 - 14.0 %    PLATELET 204 130 - 400 K/uL    MPV 9.5 6.5 - 9.5 FL    NEUTROPHILS 59 42.2 - 75.2 %    LYMPHOCYTES 24 20.5 - 51.1 %    MONOCYTES 10 0.0 - 12.0 %    EOSINOPHILS 6 0.0 - 7.0 %    BASOPHILS 1 0.0 - 2.5 %    ABS. NEUTROPHILS 3.2 2.0 - 8.1 K/UL    ABS. LYMPHOCYTES 1.3 1.3 - 3.4 K/UL    ABS. MONOCYTES 0.5 0.1 - 1.0 K/UL    ABS. EOSINOPHILS 0.3 (H) 0.0 - 0.2 K/UL    ABS. BASOPHILS 0.0 0.0 - 0.1 K/UL    DF AUTOMATED     URINALYSIS W/ RFLX MICROSCOPIC   Result Value Ref Range    Color YELLOW      Appearance CLEAR      Specific gravity 1.015      pH (UA) 8.0      Protein NEGATIVE  mg/dL    Glucose NEGATIVE  mg/dL    Ketone NEGATIVE  mg/dL    Bilirubin NEGATIVE       Blood MODERATE      Urobilinogen 1.0 EU/dL    Nitrites NEGATIVE       Leukocyte Esterase NEGATIVE      ETHYL ALCOHOL   Result Value Ref Range    ETHYL ALCOHOL, SERUM <5.0 <5 MG/DL   DRUG SCREEN, URINE   Result Value Ref Range    AMPHETAMINES NEGATIVE  NEG      Methamphetamines NEGATIVE  NEG      BARBITURATES NEGATIVE  NEG      BENZODIAZEPINES Presumptive Positive (A) NEG      COCAINE NEGATIVE  NEG      METHADONE NEGATIVE  NEG      OPIATES NEGATIVE  NEG      PCP(PHENCYCLIDINE) BACKUP METHOD USED. DRUG NOT INCLUDED ON PANELS. (A) NEG      THC (TH-CANNABINOL) Presumptive Positive (A) NEG      TRICYCLICS NEGATIVE  NEG     HCG QL SERUM   Result Value Ref Range    HCG, Ql. NEGATIVE  NEG  URINE MICROSCOPIC   Result Value Ref Range    WBC NONE /hpf    RBC 3-5 (A) NONE /hpf    Epithelial cells 0-3 /hpf    Bacteria NONE NONE /hpf    Casts NONE NONE /lpf    Crystals, urine NONE NONE /LPF   EKG, 12 LEAD, INITIAL   Result Value Ref Range    Ventricular Rate 94 BPM    Atrial Rate 94 BPM     P-R Interval 146 ms    QRS Duration 74 ms    Q-T Interval 338 ms    QTC Calculation (Bezet) 422 ms    Calculated P Axis 56 degrees    Calculated R Axis 36 degrees    Calculated T Axis 25 degrees    Diagnosis       Normal sinus rhythm  Normal ECG  When compared with ECG of 03-Sep-2016 23:38,  No significant change was found         The pt is medically stable for mh evaluation      4:11 PM  Visit Vitals   ??? BP 102/70   ??? Pulse 93   ??? Temp 98.4 ??F (36.9 ??C)   ??? Resp 18   ??? Ht '5\' 8"'  (1.727 m)   ??? Wt 104.3 kg (230 lb)   ??? SpO2 99%   ??? BMI 34.97 kg/m2     The pt is medically stable for mh admission    The pt was seen by the [facility] social worker who discussed the case with dr Humphrey Rolls and the pt will require admission but no female beds available here at this time  The social worker will attempt to find placement for this patient    Dx--depression, suicidal gesture    6:41 PM  Per the social worker the pt is [facility] [place].  The accepting doctor is dr Karleen Hampshire and np Jamal Collin accepted the pt

## 2016-09-20 NOTE — ED Notes (Signed)
Dr Elige Radonallyne in to see pt   Pt security

## 2016-09-20 NOTE — ED Notes (Signed)
Soc worker beverly aware pt for screening

## 2016-09-20 NOTE — ED Notes (Signed)
Patient resting security at bedside.

## 2016-09-20 NOTE — ED Notes (Signed)
Call to poison control Katrina Garza  Pt increase risk for seizure however the xanax she took will balance  Pt tests performed all appropriate and does not expect anything else at this time  Supportive care x6 hrs from time of arrival 1100 and can be cleared for psych admit

## 2016-09-21 LAB — EKG 12-LEAD
Atrial Rate: 94 {beats}/min
Diagnosis: NORMAL
P Axis: 56 degrees
P-R Interval: 146 ms
Q-T Interval: 338 ms
QRS Duration: 74 ms
QTc Calculation (Bazett): 422 ms
R Axis: 36 degrees
T Axis: 25 degrees
Ventricular Rate: 94 {beats}/min

## 2016-09-21 LAB — EKG, 12 LEAD, INITIAL
Atrial Rate: 94 {beats}/min
Calculated P Axis: 56 degrees
Calculated R Axis: 36 degrees
Calculated T Axis: 25 degrees
Diagnosis: NORMAL
P-R Interval: 146 ms
Q-T Interval: 338 ms
QRS Duration: 74 ms
QTC Calculation (Bezet): 422 ms
Ventricular Rate: 94 {beats}/min

## 2016-11-02 ENCOUNTER — Inpatient Hospital Stay
Admit: 2016-11-02 | Discharge: 2016-11-02 | Disposition: A | Discharge status: Home / Self Care | Payer: MEDICAID | Attending: Emergency Medicine

## 2016-11-02 DIAGNOSIS — L732 Hidradenitis suppurativa: Secondary | ICD-10-CM

## 2016-11-02 NOTE — ED Notes (Signed)
States  She  Is  Here  For  Right  Axillary  Wound  Check

## 2016-11-02 NOTE — ED Triage Notes (Signed)
Pt presents for wound check and dressing change to wound, hidradenitis of right axilla. Surgery performed on 10/27/16 @ [facility]. Last dressing change 10/31/16. Pt has appointment at 1300 @ [facility] , but can't get there.

## 2016-11-02 NOTE — ED Provider Notes (Addendum)
HPI Comments: 31 y/o female presents for right axilla wound check and dressing change s/p hidradenitis I&D. Surgery performed on 10/27/16 at [facility]. Last dressing change 10/31/16. Pt has appointment at 1300 at [facility] , but can't get there because of lack of transportation. Pt denies fever, chills or any sign of infection    Patient is a 31 y.o. female presenting with wound check. The history is provided by the patient.   Wound Check   Pertinent negatives include no chest pain, no headaches and no shortness of breath.        Past Medical History:   Diagnosis Date   ??? Anxiety    ??? Depression    ??? Psychiatric disorder     depression   ??? PTSD (post-traumatic stress disorder)        Past Surgical History:   Procedure Laterality Date   ??? HX GYN      c-section x 2         History reviewed. No pertinent family history.    Social History     Social History   ??? Marital status: MARRIED     Spouse name: N/A   ??? Number of children: N/A   ??? Years of education: N/A     Occupational History   ??? Not on file.     Social History Main Topics   ??? Smoking status: Current Every Day Smoker   ??? Smokeless tobacco: Never Used   ??? Alcohol use Yes      Comment: occasional   ??? Drug use: No   ??? Sexual activity: Not on file     Other Topics Concern   ??? Not on file     Social History Narrative         ALLERGIES: Clindamycin    Review of Systems   Constitutional: Negative for activity change, chills, fatigue and fever.   HENT: Negative for congestion, drooling, mouth sores and sore throat.    Eyes: Negative for discharge and redness.   Respiratory: Negative for cough, chest tightness, shortness of breath, wheezing and stridor.    Cardiovascular: Negative for chest pain, palpitations and leg swelling.   Gastrointestinal: Negative for diarrhea, nausea and vomiting.   Endocrine: Negative for polydipsia, polyphagia and polyuria.   Genitourinary: Negative for dysuria, frequency and urgency.    Musculoskeletal: Negative for gait problem and neck stiffness.   Skin: Positive for wound (wound check to right axilla s/p I&D). Negative for color change, pallor and rash.   Allergic/Immunologic: Negative for environmental allergies and food allergies.   Neurological: Negative for dizziness, weakness, light-headedness and headaches.   Psychiatric/Behavioral: Negative for agitation. The patient is not nervous/anxious.    All other systems reviewed and are negative.      Vitals:    11/02/16 1154   Weight: 104.3 kg (230 lb)   Height:  (1.727 m)            Physical Exam   Constitutional: She is oriented to person, place, and time. She appears well-developed and well-nourished. No distress.   HENT:   Head: Normocephalic.   Right Ear: External ear normal.   Left Ear: External ear normal.   Nose: Nose normal.   Eyes: Conjunctivae and EOM are normal. Pupils are equal, round, and reactive to light. Right eye exhibits no discharge. No scleral icterus.   Neck: Normal range of motion. Neck supple.   Cardiovascular: Normal rate, regular rhythm, normal heart sounds and intact distal pulses.  Exam reveals no gallop and no friction rub.    No murmur heard.  Pulmonary/Chest: Effort normal and breath sounds normal. No stridor. No respiratory distress. She has no wheezes. She has no rales. She exhibits no tenderness.   Abdominal: Soft. Bowel sounds are normal. She exhibits no distension and no mass. There is no tenderness. There is no rebound and no guarding.   Musculoskeletal: Normal range of motion. She exhibits no edema or tenderness.   Lymphadenopathy:     She has no cervical adenopathy.   Neurological: She is alert and oriented to person, place, and time. She has normal reflexes. No cranial nerve deficit.   Skin: No rash noted. She is not diaphoretic. No erythema. No pallor.   Right axillary wound with packing. +TTP around the wound area. +pus discharge soaked on packing. Neg wound erythema/swelling/axillary  lymphadenopathy   Psychiatric: She has a normal mood and affect.   Nursing note and vitals reviewed.       MDM      ED Course       Procedures    <EMERGENCY DEPARTMENT CASE SUMMARY>    Impression/Differential Diagnosis: right axilla wound with packing s/p I&D 6 days ago    Plan: wound care    ED Course: Approx 1 1/2 hours later after I called Dr. Elby Showers office concerning wound packing since it has not been change for 6 days, he called back and recommended packing removal, by then pt already left saying she is going to Dr. Elby Showers at [facility]. Right axillary wound thoroughly exam, +pus soaked with packing. Wound cleaned with dermal wound cleanser. Bacitracin at the edges and dressing applied. Keep wound clean. Clean wound area with warm soap water (no peroxide or iodine) and dressing change daily as instructed. Pt advised to F/u with Dr. Elby Showers today as discussed for packing removal.  Return to ED if symptoms worsen    Final Impression/Diagnosis: wound check    Patient condition at time of disposition: Stable, d/c home        I have reviewed the following home medications:    Prior to Admission medications    Medication Sig Start Date End Date Taking? Authorizing Provider   omeprazole (PRILOSEC) 40 mg capsule Take 40 mg by mouth daily.   Yes Phys Other, MD   escitalopram oxalate (LEXAPRO) 10 mg tablet Take 20 mg by mouth daily.   Yes Phys Other, MD         Vida Roller, PA    I was personally available for consultation in the emergency department.  I have reviewed the chart and agree with the documentation recorded by the [facility], including the assessment, treatment plan, and disposition.  Doreene Adas, MD

## 2016-11-02 NOTE — ED Notes (Signed)
Discharge  instructions  Given  At  This  Time  Pt  Has  A  appt   At  [facility]  At  This  Time

## 2016-11-06 ENCOUNTER — Inpatient Hospital Stay
Admit: 2016-11-06 | Discharge: 2016-11-06 | Disposition: A | Discharge status: Home / Self Care | Payer: MEDICAID | Attending: Emergency Medicine

## 2016-11-06 DIAGNOSIS — L732 Hidradenitis suppurativa: Secondary | ICD-10-CM

## 2016-11-06 MED ORDER — IBUPROFEN 600 MG TAB
600 mg | ORAL_TABLET | Freq: Four times a day (QID) | ORAL | 0 refills | Status: DC | PRN
Start: 2016-11-06 — End: 2016-12-09

## 2016-11-06 NOTE — ED Triage Notes (Signed)
Patient had I&D to right axilla on April 3rd at [facility]; reports she needs wound check and states it's more convenient to come to this ED

## 2016-11-06 NOTE — ED Provider Notes (Addendum)
HPI Comments:       Patient had I&D to right axilla on April 3rd at [facility]; reports she needs wound check and states it's more convenient to come to this ED          Patient is a 31 y.o. female presenting with wound check. The history is provided by the patient.   Wound Check   This is a new problem. The current episode started more than 1 week ago. The problem has been gradually improving. Pertinent negatives include no chest pain, no abdominal pain, no headaches and no shortness of breath. Nothing aggravates the symptoms. The symptoms are relieved by NSAIDs. Treatments tried: wound changes. The treatment provided significant relief.        Past Medical History:   Diagnosis Date   ??? Anxiety    ??? Depression    ??? Psychiatric disorder     depression   ??? PTSD (post-traumatic stress disorder)        Past Surgical History:   Procedure Laterality Date   ??? HX GYN      c-section x 2         History reviewed. No pertinent family history.    Social History     Social History   ??? Marital status: MARRIED     Spouse name: N/A   ??? Number of children: N/A   ??? Years of education: N/A     Occupational History   ??? Not on file.     Social History Main Topics   ??? Smoking status: Current Every Day Smoker   ??? Smokeless tobacco: Never Used   ??? Alcohol use Yes      Comment: occasional   ??? Drug use: No   ??? Sexual activity: Not on file     Other Topics Concern   ??? Not on file     Social History Narrative         ALLERGIES: Clindamycin    Review of Systems   Constitutional: Negative for activity change and fever.   HENT: Negative for sinus pain and trouble swallowing.    Eyes: Negative for pain and visual disturbance.   Respiratory: Negative for chest tightness and shortness of breath.    Cardiovascular: Negative for chest pain and palpitations.   Gastrointestinal: Negative for abdominal pain and nausea.   Endocrine: Negative for polydipsia and polyuria.   Genitourinary: Negative for dysuria and flank pain.    Musculoskeletal: Negative for back pain, gait problem and joint swelling.   Skin: Positive for wound. Negative for color change and rash.   Allergic/Immunologic: Negative for environmental allergies and food allergies.   Neurological: Negative for syncope, numbness and headaches.   Hematological: Does not bruise/bleed easily.   Psychiatric/Behavioral: Negative for confusion and suicidal ideas.   All other systems reviewed and are negative.      Vitals:    11/06/16 1130   BP: 122/78   Pulse: 98   Resp: 18   Temp: 97.5 ??F (36.4 ??C)   SpO2: 98%   Weight: 105.2 kg (232 lb)   Height:  (1.727 m)            Physical Exam   Constitutional: She is oriented to person, place, and time. She appears well-developed and well-nourished. No distress.   HENT:   Head: Normocephalic.   Mouth/Throat: Oropharynx is clear and moist. No oropharyngeal exudate.   Eyes: EOM are normal. Pupils are equal, round, and reactive to light.  Neck: Normal range of motion. Neck supple.   Cardiovascular: Normal rate, regular rhythm, normal heart sounds and intact distal pulses.  Exam reveals no gallop and no friction rub.    No murmur heard.  Pulmonary/Chest: Effort normal and breath sounds normal. No stridor. No respiratory distress. She has no wheezes. She has no rales. She exhibits no tenderness.   Abdominal: Soft. Bowel sounds are normal. There is no tenderness. There is no rebound and no guarding.   Musculoskeletal: Normal range of motion. She exhibits no edema, tenderness or deformity.   Lymphadenopathy:     She has no cervical adenopathy.   Neurological: She is alert and oriented to person, place, and time.   Skin: Skin is warm and dry.        Psychiatric: She has a normal mood and affect.   Nursing note and vitals reviewed.       MDM      ED Course       Procedures      <EMERGENCY DEPARTMENT CASE SUMMARY>    Impression/Differential Diagnosis: Wound Change/Check    Plan: Wound check, Dressing change   ED Course: Wound checked/ good granulation, wound packed. Dressing changed. Stable discharged home.  Keep wound clean. Wound care/infection precaution discussed with patient in detail. Continue home dressing change as instructed. F/u with PCP for wound check in 2 days.  Return to ED if symptoms worsen  Final Impression/Diagnosis: Wound check     Patient condition at time of disposition: Stable       I have reviewed the following home medications:    Prior to Admission medications    Medication Sig Start Date End Date Taking? Authorizing Provider   ibuprofen (MOTRIN) 600 mg tablet Take 600 mg by mouth every six (6) hours as needed for Pain.   Yes Phys Other, MD   omeprazole (PRILOSEC) 40 mg capsule Take 40 mg by mouth daily.   Yes Phys Other, MD   escitalopram oxalate (LEXAPRO) 10 mg tablet Take 20 mg by mouth daily.   Yes Phys Other, MD         Loma Messing, NP                   I was personally available for consultation in the emergency department.  I have reviewed the chart and agree with the documentation recorded by the midlevel provider, including the assessment, treatment plan, and disposition.    Billy Fischer, MD

## 2016-11-06 NOTE — ED Notes (Signed)
I have reviewed discharge instructions with the patient.  The patient verbalized understanding.  Pt ambulatory to exit.

## 2016-11-08 ENCOUNTER — Inpatient Hospital Stay
Admit: 2016-11-08 | Discharge: 2016-11-08 | Disposition: A | Discharge status: Home / Self Care | Payer: MEDICAID | Attending: Emergency Medicine

## 2016-11-08 DIAGNOSIS — L732 Hidradenitis suppurativa: Secondary | ICD-10-CM

## 2016-11-08 MED ORDER — CEPHALEXIN 500 MG CAP
500 mg | ORAL_CAPSULE | Freq: Four times a day (QID) | ORAL | 0 refills | Status: AC
Start: 2016-11-08 — End: 2016-11-18

## 2016-11-08 NOTE — ED Notes (Signed)
Right axilla wound open, draining yellowish drainage , no odor noted, wound bed pink and moist, packing removed and new packing inserted, area covered with a dsd

## 2016-11-08 NOTE — ED Notes (Signed)
D/c given wound care discussed , enc to follow up with original surgeon, pt reports md has apt on tues, script sent to pharm, pt aware,pt amb off unit

## 2016-11-08 NOTE — ED Triage Notes (Signed)
Right axilla dressing change , had  Surgery of the abscess 4/3 and has to follow up daily for packing change, original surgery was  Done at [facility], not currently on antibiotics , no fever , no complaints

## 2016-11-08 NOTE — ED Provider Notes (Signed)
HPI Comments: This pt is bib self for another wound check. Pt had I and D at [facility] several days ago and has been seen by ed here for several wound checks. No dm, not on any meds.     Pt states less pain and less drainage     Patient is a 31 y.o. female presenting with wound check. The history is provided by the patient.   Wound Check   This is a recurrent problem. The problem has not changed since onset.Pertinent negatives include no chest pain, no abdominal pain, no headaches and no shortness of breath.        Past Medical History:   Diagnosis Date   ??? Anxiety    ??? Depression    ??? Psychiatric disorder     depression   ??? PTSD (post-traumatic stress disorder)        Past Surgical History:   Procedure Laterality Date   ??? HX GYN      c-section x 2         No family history on file.    Social History     Social History   ??? Marital status: MARRIED     Spouse name: N/A   ??? Number of children: N/A   ??? Years of education: N/A     Occupational History   ??? Not on file.     Social History Main Topics   ??? Smoking status: Current Every Day Smoker   ??? Smokeless tobacco: Never Used   ??? Alcohol use Yes      Comment: occasional   ??? Drug use: No   ??? Sexual activity: Not on file     Other Topics Concern   ??? Not on file     Social History Narrative         ALLERGIES: Clindamycin    Review of Systems   Constitutional: Negative for chills and fever.   HENT: Negative for congestion and rhinorrhea.    Eyes: Negative for pain and visual disturbance.   Respiratory: Negative for cough and shortness of breath.    Cardiovascular: Negative for chest pain and palpitations.   Gastrointestinal: Negative for abdominal pain and blood in stool.   Genitourinary: Negative for difficulty urinating, dysuria and frequency.   Musculoskeletal: Negative for back pain and myalgias.   Skin: Negative for color change and rash.   Neurological: Negative for seizures and headaches.   Psychiatric/Behavioral: Negative for dysphoric mood and self-injury.        Vitals:    11/08/16 1031 11/08/16 1032   BP:  103/65   Pulse:  92   Resp:  18   Temp:  98.6 ??F (37 ??C)   SpO2:  98%   Weight: 104.3 kg (230 lb)    Height:  (1.727 m)             Physical Exam   Constitutional: She is oriented to person, place, and time. She appears well-developed and well-nourished. No distress.   HENT:   Head: Normocephalic and atraumatic.   Eyes: Conjunctivae are normal. Pupils are equal, round, and reactive to light. No scleral icterus.   Neck: Normal range of motion. Neck supple. No tracheal deviation present.   Cardiovascular: Normal rate and regular rhythm.    No murmur heard.  Pulmonary/Chest: Effort normal and breath sounds normal. No respiratory distress. She has no wheezes. She exhibits no tenderness.   Abdominal: Soft. Bowel sounds are normal. She exhibits no distension. There is  no tenderness.   Musculoskeletal: Normal range of motion. She exhibits no edema.   Right adnexal clean 4cm wound with packing scant dc no fever no redness    Neurological: She is alert and oriented to person, place, and time. No cranial nerve deficit. She exhibits normal muscle tone.   Skin: Skin is warm and dry. No rash noted. She is not diaphoretic.   Psychiatric: She has a normal mood and affect. Her behavior is normal.   Nursing note and vitals reviewed.       MDM      ED Course       Procedures      <EMERGENCY DEPARTMENT CASE SUMMARY>    Impression/Differential Diagnosis: wound check, I and D done in OR at [facility]     Plan: wound packing change     ED Course:   Will cover with abx and refer back to surgeon    Final Impression/Diagnosis:   1. Wound check, abscess          Patient condition at time of disposition: stable       I have reviewed the following home medications:    Prior to Admission medications    Medication Sig Start Date End Date Taking? Authorizing Provider   cephALEXin (KEFLEX) 500 mg capsule Take 1 Cap by mouth four (4) times daily for 10 days. 11/08/16 11/18/16 Yes Arvella Nigh, MD    omeprazole (PRILOSEC) 40 mg capsule Take 40 mg by mouth daily.   Yes Phys Other, MD   escitalopram oxalate (LEXAPRO) 10 mg tablet Take 20 mg by mouth daily.   Yes Phys Other, MD   ibuprofen (MOTRIN) 600 mg tablet Take 1 Tab by mouth every six (6) hours as needed for Pain. 11/06/16   Loma Messing, NP         Arvella Nigh, MD

## 2016-11-11 ENCOUNTER — Inpatient Hospital Stay
Admit: 2016-11-11 | Discharge: 2016-11-11 | Disposition: A | Discharge status: Home / Self Care | Payer: MEDICAID | Attending: Emergency Medicine

## 2016-11-11 DIAGNOSIS — L732 Hidradenitis suppurativa: Secondary | ICD-10-CM

## 2016-11-11 NOTE — ED Provider Notes (Addendum)
HPI Comments: Post-op 10/27/2016 Rt axillary cyst removed. Dressing , packing intact. Requesting dressing change    Patient is a 31 y.o. female presenting with dressing change. The history is provided by the patient.   Dressing Change   This is a recurrent problem. The current episode started more than 1 week ago. The problem has been gradually improving. Pertinent negatives include no chest pain, no abdominal pain, no headaches and no shortness of breath. Nothing aggravates the symptoms. Nothing relieves the symptoms. Treatments tried: dressing changes. The treatment provided significant relief.        Past Medical History:   Diagnosis Date   ??? Anxiety    ??? Depression    ??? Psychiatric disorder     depression   ??? PTSD (post-traumatic stress disorder)        Past Surgical History:   Procedure Laterality Date   ??? HX GYN      c-section x 2         History reviewed. No pertinent family history.    Social History     Social History   ??? Marital status: MARRIED     Spouse name: N/A   ??? Number of children: N/A   ??? Years of education: N/A     Occupational History   ??? Not on file.     Social History Main Topics   ??? Smoking status: Current Every Day Smoker   ??? Smokeless tobacco: Never Used   ??? Alcohol use Yes      Comment: occasional   ??? Drug use: No   ??? Sexual activity: Not on file     Other Topics Concern   ??? Not on file     Social History Narrative         ALLERGIES: Clindamycin    Review of Systems   Constitutional: Negative for activity change and fever.   HENT: Negative for sinus pain and trouble swallowing.    Eyes: Negative for pain and visual disturbance.   Respiratory: Negative for chest tightness and shortness of breath.    Cardiovascular: Negative for chest pain and palpitations.   Gastrointestinal: Negative for abdominal pain and nausea.   Endocrine: Negative for polydipsia and polyuria.   Genitourinary: Negative for dysuria and flank pain.   Musculoskeletal: Negative for back pain, gait problem and joint swelling.    Skin: Positive for wound. Negative for color change and rash.   Allergic/Immunologic: Negative for environmental allergies and food allergies.   Neurological: Negative for syncope, numbness and headaches.   Hematological: Does not bruise/bleed easily.   Psychiatric/Behavioral: Negative for confusion and suicidal ideas.   All other systems reviewed and are negative.      Vitals:    11/11/16 1046   BP: 120/79   Pulse: 87   Resp: 18   Temp: 98.1 ??F (36.7 ??C)   SpO2: 96%   Weight: 104.3 kg (230 lb)   Height:  (1.727 m)            Physical Exam   Constitutional: She is oriented to person, place, and time. She appears well-developed and well-nourished. No distress.   HENT:   Head: Normocephalic.   Mouth/Throat: Oropharynx is clear and moist. No oropharyngeal exudate.   Eyes: EOM are normal. Pupils are equal, round, and reactive to light.   Neck: Normal range of motion. Neck supple.   Cardiovascular: Normal rate, regular rhythm, normal heart sounds and intact distal pulses.  Exam reveals no gallop and no  friction rub.    No murmur heard.  Pulmonary/Chest: Effort normal and breath sounds normal. No stridor. No respiratory distress. She has no wheezes. She has no rales. She exhibits no tenderness.   Abdominal: Soft. Bowel sounds are normal. There is no tenderness. There is no rebound and no guarding.   Musculoskeletal: Normal range of motion. She exhibits no edema or tenderness.   Lymphadenopathy:     She has no cervical adenopathy.   Neurological: She is alert and oriented to person, place, and time.   Skin: Skin is warm and dry.        Psychiatric: She has a normal mood and affect.   Nursing note and vitals reviewed.       MDM      ED Course       Procedures    <EMERGENCY DEPARTMENT CASE SUMMARY>    Impression/Differential Diagnosis: dressing change    Plan: wound check, repack and change dressing     ED Course: wound check, repack and change dressing. Instructed pt to  continue good good care and continue full course of abt tx . Return in 2 days for wound check in the ED     Final Impression/Diagnosis: wound check  Patient condition at time of disposition: stable  Stable discharged home.  Keep wound clean. Wound care/infection precaution discussed with patient in detail. Continue home dressing change as instructed. F/u with PCP for wound check in 2 days.  Return to ED if symptoms worsen  I have reviewed the following home medications:    Prior to Admission medications    Medication Sig Start Date End Date Taking? Authorizing Provider   cephALEXin (KEFLEX) 500 mg capsule Take 1 Cap by mouth four (4) times daily for 10 days. 11/08/16 11/18/16  Arvella Nigh, MD   ibuprofen (MOTRIN) 600 mg tablet Take 1 Tab by mouth every six (6) hours as needed for Pain. 11/06/16   Raquel I Castillo-Rodriguez, NP   omeprazole (PRILOSEC) 40 mg capsule Take 40 mg by mouth daily.    Phys Other, MD   escitalopram oxalate (LEXAPRO) 10 mg tablet Take 20 mg by mouth daily.    Phys Other, MD         Loma Messing, NP       I was personally available for consultation in the emergency department.  I have reviewed the chart and agree with the documentation recorded by the [facility], including the assessment, treatment plan, and disposition.  Doreene Adas, MD

## 2016-11-11 NOTE — ED Triage Notes (Signed)
Post-op 10/27/2016 Rt axillary cyst removed. Dressing , packing intact. Requesting dressing change

## 2016-11-11 NOTE — ED Notes (Addendum)
Patient discharge instructions given to patient. Continue medications. Follow up in 2 days for wound re check and packing removal. Patient verbalized understanding. Discharged home via private car

## 2016-11-13 ENCOUNTER — Inpatient Hospital Stay
Admit: 2016-11-13 | Discharge: 2016-11-13 | Disposition: A | Discharge status: Home / Self Care | Payer: MEDICAID | Attending: Emergency Medicine

## 2016-11-13 DIAGNOSIS — Z4801 Encounter for change or removal of surgical wound dressing: Secondary | ICD-10-CM

## 2016-11-13 NOTE — ED Triage Notes (Signed)
Pt for wound check to under right arm third visit for same

## 2016-11-13 NOTE — ED Provider Notes (Addendum)
HPI Comments: 31 y/o female presents for wound check to right axilla s/p I&D 3-4 weeks ago and pt denies fever, chills, lymphadenopathy, pain    Patient is a 31 y.o. female presenting with wound check. The history is provided by the patient.   Wound Check   Pertinent negatives include no chest pain, no headaches and no shortness of breath.        Past Medical History:   Diagnosis Date   ??? Anxiety    ??? Depression    ??? Psychiatric disorder     depression   ??? PTSD (post-traumatic stress disorder)        Past Surgical History:   Procedure Laterality Date   ??? HX GYN      c-section x 2         History reviewed. No pertinent family history.    Social History     Social History   ??? Marital status: MARRIED     Spouse name: N/A   ??? Number of children: N/A   ??? Years of education: N/A     Occupational History   ??? Not on file.     Social History Main Topics   ??? Smoking status: Current Every Day Smoker   ??? Smokeless tobacco: Never Used   ??? Alcohol use Yes      Comment: occasional   ??? Drug use: No   ??? Sexual activity: Not on file     Other Topics Concern   ??? Not on file     Social History Narrative         ALLERGIES: Clindamycin    Review of Systems   Constitutional: Negative for activity change, chills, fatigue and fever.   HENT: Negative for drooling, mouth sores and sore throat.    Eyes: Negative for discharge and redness.   Respiratory: Negative for cough, chest tightness, shortness of breath, wheezing and stridor.    Cardiovascular: Negative for chest pain, palpitations and leg swelling.   Gastrointestinal: Negative for diarrhea, nausea and vomiting.   Endocrine: Negative for polydipsia, polyphagia and polyuria.   Genitourinary: Negative for dysuria, frequency and urgency.   Musculoskeletal: Negative for gait problem and neck stiffness.   Skin: Positive for wound (wound check to right axilla s/p I&D). Negative for color change, pallor and rash.   Allergic/Immunologic: Negative for environmental allergies and food allergies.    Neurological: Negative for dizziness, weakness, light-headedness and headaches.   Psychiatric/Behavioral: Negative for agitation. The patient is not nervous/anxious.    All other systems reviewed and are negative.      Vitals:    11/13/16 1145   BP: 140/78   Pulse: 95   Resp: 18   Temp: 96.4 ??F (35.8 ??C)   SpO2: 98%   Weight: 104.3 kg (230 lb)   Height:  (1.727 m)            Physical Exam   Constitutional: She is oriented to person, place, and time. She appears well-developed and well-nourished. No distress.   HENT:   Head: Normocephalic.   Right Ear: External ear normal.   Left Ear: External ear normal.   Nose: Nose normal.   Eyes: Conjunctivae and EOM are normal. Pupils are equal, round, and reactive to light. Right eye exhibits no discharge. No scleral icterus.   Neck: Normal range of motion. Neck supple.   Cardiovascular: Normal rate, regular rhythm, normal heart sounds and intact distal pulses.  Exam reveals no gallop.    No  murmur heard.  Pulmonary/Chest: Effort normal and breath sounds normal. No stridor. No respiratory distress. She has no wheezes. She has no rales. She exhibits no tenderness.   Abdominal: Soft. Bowel sounds are normal. She exhibits no distension and no mass. There is no tenderness. There is no rebound and no guarding.   Lymphadenopathy:     She has no cervical adenopathy.   Neurological: She is alert and oriented to person, place, and time. She has normal reflexes. No cranial nerve deficit.   Skin: No rash noted. She is not diaphoretic. No erythema. No pallor.        Psychiatric: She has a normal mood and affect.   Nursing note and vitals reviewed.       MDM      ED Course       Procedures  <EMERGENCY DEPARTMENT CASE SUMMARY>    Impression/Differential Diagnosis: right axilla wound s/p I&D    Plan: wound care    ED Course: Right axilla wound pacing removed and thoroughly exam, no pus/discharge. Wound cleaned with dermal wound cleanser. Packed with  xeroform petroleum jelly and dressing applied. Keep wound clean. Clean wound area with warm soap water (no peroxide or iodine) and dressing change daily as instructed. F/u with PCP or assigned, and ED for wound check in 2 days for packing removal.  Return to ED if symptoms worsen      Final Impression/Diagnosis: wound recheck    Patient condition at time of disposition: Stable, d/c home        I have reviewed the following home medications:    Prior to Admission medications    Medication Sig Start Date End Date Taking? Authorizing Provider   cephALEXin (KEFLEX) 500 mg capsule Take 1 Cap by mouth four (4) times daily for 10 days. 11/08/16 11/18/16 Yes Arvella Nigh, MD   ibuprofen (MOTRIN) 600 mg tablet Take 1 Tab by mouth every six (6) hours as needed for Pain. 11/06/16  Yes Raquel I Castillo-Rodriguez, NP   omeprazole (PRILOSEC) 40 mg capsule Take 40 mg by mouth daily.   Yes Phys Other, MD   escitalopram oxalate (LEXAPRO) 10 mg tablet Take 20 mg by mouth daily.   Yes Phys Other, MD         Vida Roller, PA                       I was personally available for consultation in the emergency department.  I have reviewed the chart and agree with the documentation recorded by the midlevel provider, including the assessment, treatment plan, and disposition.    Billy Fischer, MD

## 2016-11-13 NOTE — ED Notes (Signed)
Wound care by PA/ dressing changed / pt for discharge to home/ to follow up as directed

## 2016-11-15 ENCOUNTER — Inpatient Hospital Stay
Admit: 2016-11-15 | Discharge: 2016-11-15 | Disposition: A | Discharge status: Home / Self Care | Payer: MEDICAID | Attending: Emergency Medicine

## 2016-11-15 DIAGNOSIS — Z4801 Encounter for change or removal of surgical wound dressing: Secondary | ICD-10-CM

## 2016-11-15 NOTE — ED Notes (Signed)
Wound care and dressing provided by PA.  I have reviewed discharge instructions with the patient.  The patient verbalized understanding.  Pt ambulatory to exit.

## 2016-11-15 NOTE — ED Triage Notes (Signed)
Patient here for wound check cyst removed from right axilla on 10/27/2016 and here for packing removal and dressing change. Continues to take antibiotic and has wound care at [facility] starts on Monday.

## 2016-11-15 NOTE — ED Provider Notes (Addendum)
HPI Comments: 31 y/o female presents for right axillary wound recheck s/p I&D 3 weeks ago and denies any signs of infection at this time    Patient is a 31 y.o. female presenting with wound check. The history is provided by the patient.   Wound Check   Pertinent negatives include no chest pain, no headaches and no shortness of breath.        Past Medical History:   Diagnosis Date   ??? Anxiety    ??? Depression    ??? Psychiatric disorder     depression   ??? PTSD (post-traumatic stress disorder)        Past Surgical History:   Procedure Laterality Date   ??? HX GYN      c-section x 2         History reviewed. No pertinent family history.    Social History     Social History   ??? Marital status: MARRIED     Spouse name: N/A   ??? Number of children: N/A   ??? Years of education: N/A     Occupational History   ??? Not on file.     Social History Main Topics   ??? Smoking status: Current Every Day Smoker   ??? Smokeless tobacco: Never Used   ??? Alcohol use Yes      Comment: occasional   ??? Drug use: No   ??? Sexual activity: Not on file     Other Topics Concern   ??? Not on file     Social History Narrative         ALLERGIES: Clindamycin    Review of Systems   Constitutional: Negative for activity change, chills, fatigue and fever.   HENT: Negative for drooling, mouth sores and sore throat.    Eyes: Negative for discharge and redness.   Respiratory: Negative for shortness of breath, wheezing and stridor.    Cardiovascular: Negative for chest pain, palpitations and leg swelling.   Gastrointestinal: Negative for diarrhea, nausea and vomiting.   Endocrine: Negative for polydipsia, polyphagia and polyuria.   Genitourinary: Negative for dysuria, frequency and urgency.   Musculoskeletal: Negative for gait problem and neck stiffness.   Skin: Positive for wound (right axillary wound recheck). Negative for color change, pallor and rash.   Allergic/Immunologic: Negative for environmental allergies and food allergies.    Neurological: Negative for dizziness, weakness, light-headedness and headaches.   Psychiatric/Behavioral: Negative for agitation. The patient is not nervous/anxious.    All other systems reviewed and are negative.      Vitals:    11/15/16 1249   BP: 128/82   Pulse: 94   Resp: 16   Temp: 97.3 ??F (36.3 ??C)   SpO2: 99%   Weight: 104.3 kg (230 lb)   Height:  (1.727 m)            Physical Exam   Constitutional: She is oriented to person, place, and time. She appears well-developed and well-nourished. No distress.   HENT:   Head: Normocephalic.   Right Ear: External ear normal.   Left Ear: External ear normal.   Nose: Nose normal.   Eyes: Conjunctivae and EOM are normal. Pupils are equal, round, and reactive to light. Right eye exhibits no discharge. No scleral icterus.   Neck: Normal range of motion. Neck supple.   Cardiovascular: Normal rate, regular rhythm, normal heart sounds and intact distal pulses.  Exam reveals no gallop and no friction rub.    No murmur  heard.  Pulmonary/Chest: Effort normal and breath sounds normal. No stridor. No respiratory distress. She has no wheezes. She has no rales. She exhibits no tenderness.   Abdominal: Soft. Bowel sounds are normal. She exhibits no distension and no mass. There is no tenderness. There is no rebound and no guarding.   Musculoskeletal: Normal range of motion. She exhibits no edema or tenderness.   Lymphadenopathy:     She has no cervical adenopathy.   Neurological: She is alert and oriented to person, place, and time. She has normal reflexes. No cranial nerve deficit.   Skin: No rash noted. She is not diaphoretic. No erythema. No pallor.   Right axilla wound with packing. Wound well healing, No wound pus/discharge/TTP. Neg right axillary lymphadenopathy    Psychiatric: She has a normal mood and affect.   Nursing note and vitals reviewed.       MDM      ED Course       Procedures      <EMERGENCY DEPARTMENT CASE SUMMARY>     Impression/Differential Diagnosis: right axillary wound recheck s/p I&D 3 weeks ago    Plan: wound care    ED Course: Right axillary wound packing removed and thoroughly exam, no wound pus/discharge. Wound cleaned with dermal wound cleanser. Bacitracin and dressing applied. Keep wound clean. Clean wound with warm soap water (no peroxide or iodine) and apply thin coat of bacitracin and dressing change as instructed. F/u with PCP or assigned, and ED for wound check in 3 days.  Return to ED if symptoms worsen    Final Impression/Diagnosis: right axilla wound recheck    Patient condition at time of disposition: Stable, d/c home        I have reviewed the following home medications:    Prior to Admission medications    Medication Sig Start Date End Date Taking? Authorizing Provider   cephALEXin (KEFLEX) 500 mg capsule Take 1 Cap by mouth four (4) times daily for 10 days. 11/08/16 11/18/16 Yes Arvella Nigh, MD   ibuprofen (MOTRIN) 600 mg tablet Take 1 Tab by mouth every six (6) hours as needed for Pain. 11/06/16  Yes Raquel I Castillo-Rodriguez, NP   omeprazole (PRILOSEC) 40 mg capsule Take 40 mg by mouth daily.   Yes Phys Other, MD   escitalopram oxalate (LEXAPRO) 10 mg tablet Take 20 mg by mouth daily.   Yes Phys Other, MD         Vida Roller, PA     I was personally available for consultation in the emergency department.  I have reviewed the chart and agree with the documentation recorded by the [facility], including the assessment, treatment plan, and disposition.  Doreene Adas, MD

## 2016-11-17 ENCOUNTER — Inpatient Hospital Stay: Admit: 2016-11-17 | Payer: MEDICAID | Primary: Adolescent Medicine

## 2016-11-17 DIAGNOSIS — F329 Major depressive disorder, single episode, unspecified: Secondary | ICD-10-CM

## 2016-11-17 LAB — CBC W/O DIFF
HCT: 41.2 % (ref 36.0–48.0)
HGB: 14.3 g/dL (ref 12.0–16.0)
MCH: 31.3 PG — ABNORMAL HIGH (ref 27.0–31.0)
MCHC: 34.7 g/dL (ref 31.0–37.0)
MCV: 90.2 FL (ref 80.0–100.0)
MPV: 10.4 FL — ABNORMAL HIGH (ref 6.5–9.5)
PLATELET: 237 10*3/uL (ref 130–400)
RBC: 4.57 M/uL (ref 4.20–5.40)
RDW: 12.8 % (ref 11.5–14.0)
WBC: 5.2 10*3/uL (ref 4.8–11.0)

## 2016-11-17 LAB — METABOLIC PANEL, COMPREHENSIVE
A-G Ratio: 1 (ref 1.0–1.5)
ALT (SGPT): 40 U/L (ref 12–78)
AST (SGOT): 20 U/L (ref 15–37)
Albumin: 3.8 g/dL (ref 3.4–5.0)
Alk. phosphatase: 72 U/L (ref 46–116)
Anion gap: 6 mmol/L (ref 4–12)
BUN: 11 mg/dL (ref 7–18)
Bilirubin, total: 1.1 mg/dL — ABNORMAL HIGH (ref 0.2–1.0)
CO2: 30 mmol/L (ref 21–32)
Calcium: 8.7 mg/dL (ref 8.5–10.1)
Chloride: 105 mmol/L (ref 98–107)
Creatinine: 0.71 mg/dL (ref 0.55–1.02)
GFR est AA: >60 mL/min/{1.73_m2} (ref >60)
GFR est non-AA: >60 mL/min/{1.73_m2} (ref >60)
Globulin: 3.7 g/dL (ref 2.8–3.9)
Glucose: 91 mg/dL (ref 74–106)
Potassium: 4 mmol/L (ref 3.5–5.1)
Protein, total: 7.5 g/dL (ref 6.4–8.2)
Sodium: 141 mmol/L (ref 136–145)

## 2016-11-18 LAB — T4 (THYROXINE): T4, Total: 7.2 ug/dL (ref 4.7–13.3)

## 2016-11-18 LAB — T3 UPTAKE PROFILE: T3 Uptake: 31 % (ref 30–39)

## 2016-11-18 LAB — TSH 3RD GENERATION: TSH: 0.765 u[IU]/mL (ref 0.360–3.740)

## 2016-12-09 ENCOUNTER — Emergency Department: Admit: 2016-12-09 | Payer: MEDICAID | Primary: Adolescent Medicine

## 2016-12-09 ENCOUNTER — Inpatient Hospital Stay
Admit: 2016-12-09 | Discharge: 2016-12-09 | Disposition: A | Discharge status: Home / Self Care | Payer: MEDICAID | Attending: Emergency Medicine

## 2016-12-09 DIAGNOSIS — S300XXA Contusion of lower back and pelvis, initial encounter: Secondary | ICD-10-CM

## 2016-12-09 MED ORDER — IBUPROFEN 600 MG TAB
600 mg | ORAL_TABLET | Freq: Four times a day (QID) | ORAL | 0 refills | Status: DC | PRN
Start: 2016-12-09 — End: 2017-01-03

## 2016-12-09 NOTE — ED Triage Notes (Signed)
Pt. Fell 2 days ago while in shower, wall grip gave out, pt. C/o pain to Lt. Hip and Lt. Side low back.

## 2016-12-09 NOTE — ED Provider Notes (Addendum)
HPI Comments: 31 y/o female c/o left lower back pain and left hip pain x 2 days from fall. Pt reports she was in shower, wall grip gave out, fell and since then has been having the pain. Pt denies low back/hip swelling, ecchymosis, erythema, weakness, loss of bowel/bladder control, dysuria, hematuria, numbness/tingling, fever, chills.      Patient is a 31 y.o. female presenting with fall. The history is provided by the patient.   Fall   Pertinent negatives include no fever, no nausea, no vomiting and no headaches.        Past Medical History:   Diagnosis Date   ??? Anxiety    ??? Depression    ??? Psychiatric disorder     depression   ??? PTSD (post-traumatic stress disorder)        Past Surgical History:   Procedure Laterality Date   ??? HX GYN      c-section x 2         History reviewed. No pertinent family history.    Social History     Social History   ??? Marital status: MARRIED     Spouse name: N/A   ??? Number of children: N/A   ??? Years of education: N/A     Occupational History   ??? Not on file.     Social History Main Topics   ??? Smoking status: Current Every Day Smoker   ??? Smokeless tobacco: Never Used   ??? Alcohol use Yes      Comment: occasional   ??? Drug use: No   ??? Sexual activity: Not on file     Other Topics Concern   ??? Not on file     Social History Narrative         ALLERGIES: Clindamycin    Review of Systems   Constitutional: Negative for activity change, chills, fatigue and fever.   HENT: Negative for drooling, mouth sores and sore throat.    Eyes: Negative for discharge and redness.   Respiratory: Negative for shortness of breath, wheezing and stridor.    Cardiovascular: Negative for chest pain, palpitations and leg swelling.   Gastrointestinal: Negative for diarrhea, nausea and vomiting.   Endocrine: Negative for polydipsia, polyphagia and polyuria.   Genitourinary: Negative for dysuria, frequency and urgency.   Musculoskeletal: Positive for arthralgias (left hip pain from fall) and  back pain (left low back pain from, fall). Negative for gait problem and neck stiffness.   Skin: Negative for color change, pallor and rash.   Allergic/Immunologic: Negative for environmental allergies and food allergies.   Neurological: Negative for dizziness, weakness, light-headedness and headaches.   Psychiatric/Behavioral: Negative for agitation. The patient is not nervous/anxious.    All other systems reviewed and are negative.      Vitals:    12/09/16 1254   BP: 120/80   Pulse: 100   Resp: 20   Temp: 97.5 ??F (36.4 ??C)   SpO2: 99%   Weight: 108.9 kg (240 lb)   Height: 5\' 8"  (1.727 m)            Physical Exam   Constitutional: She is oriented to person, place, and time. She appears well-developed and well-nourished. No distress.   HENT:   Head: Normocephalic.   Right Ear: External ear normal.   Left Ear: External ear normal.   Nose: Nose normal.   Eyes: Conjunctivae and EOM are normal. Pupils are equal, round, and reactive to light. Right eye exhibits no discharge. No  scleral icterus.   Neck: Normal range of motion. Neck supple.   Cardiovascular: Normal rate, regular rhythm, normal heart sounds and intact distal pulses.  Exam reveals no gallop.    No murmur heard.  Pulmonary/Chest: Effort normal and breath sounds normal. No stridor. No respiratory distress. She has no wheezes. She has no rales. She exhibits no tenderness.   Abdominal: Soft. Bowel sounds are normal. She exhibits no distension and no mass. There is no tenderness. There is no rebound and no guarding.   Musculoskeletal:        Left hip: She exhibits normal range of motion (+left hip pain elicited with ROM), normal strength, no tenderness, no bony tenderness, no swelling, no crepitus, no deformity and no laceration.        Lumbar back: She exhibits tenderness (+TTP left paralumbar muscles), bony tenderness and pain (+left low back pain elicited with flexion). She exhibits normal range of motion, no swelling, no edema, no deformity, no  laceration, no spasm and normal pulse.   Lymphadenopathy:     She has no cervical adenopathy.   Neurological: She is alert and oriented to person, place, and time. She has normal reflexes. No cranial nerve deficit.   Skin: No rash noted. She is not diaphoretic. No erythema. No pallor.   Psychiatric: She has a normal mood and affect.   Nursing note and vitals reviewed.       MDM      ED Course   Xr Spine Lumb Min 4 V    Result Date: 12/09/2016  Exam: Lumbar spine, 3 views. Date and time:12/09/2016 1:28 PM  History: Pain Comparison: None. Findings: There is normal curvature and alignment of the vertebral bodies.  The vertebral heights are maintained.  Intervertebral disc are unremarkable.  No fracture or dislocation.     Impression: Normal lumbar spine. THIS DOCUMENT HAS BEEN ELECTRONICALLY SIGNED BY EMMANUEL LLADO MD    Xr Hip Lt W Or Wo Pelv 2-3 Vws    Result Date: 12/09/2016  Exam: Pelvis and left hip, 3 views. Date and time:12/09/2016 1:28 PM  History: Pain S/P fall Comparison: None. Findings: Sacrum, iliac and pubic bone as well as the proximal femur are intact.  Sacroiliac and hip joints are preserved.  No fracture or dislocation.     Impression: No fracture or dislocation. THIS DOCUMENT HAS BEEN ELECTRONICALLY SIGNED BY EMMANUEL LLADO MD        Procedures  <EMERGENCY DEPARTMENT CASE SUMMARY>    Impression/Differential Diagnosis: 31 y/o female c/o left lower back pain and left hip pain x 2 days from fall. Pt reports she was in shower, wall grip gave out, fell and since then has been having the pain. Pt denies low back/hip swelling, ecchymosis, erythema, weakness, loss of bowel/bladder control, dysuria, hematuria, numbness/tingling, fever, chills.      Plan: L-spine XR, left hip XR r/o fx    ED Course: L-spine XR, left hip XR result reviewed and discussed with pt in detail. OTC lidocaine patch. Apply heat on back muscles, and back stretches exercise.  F/u with PCP or assigned PCP in 3 days. Return to ED  if symptoms worsen    Final Impression/Diagnosis: low back contusion. Left hip pain. Fall    Patient condition at time of disposition: Stable, d/c home        I have reviewed the following home medications:    Prior to Admission medications    Medication Sig Start Date End Date Taking? Authorizing Provider  omeprazole (PRILOSEC) 40 mg capsule Take 40 mg by mouth daily.   Yes Phys Other, MD   escitalopram oxalate (LEXAPRO) 10 mg tablet Take 20 mg by mouth daily.   Yes Phys Other, MD         Vida Roller, PA    Xr Spine Lumb Min 4 V    Result Date: 12/09/2016  Exam: Lumbar spine, 3 views. Date and time:12/09/2016 1:28 PM  History: Pain Comparison: None. Findings: There is normal curvature and alignment of the vertebral bodies.  The vertebral heights are maintained.  Intervertebral disc are unremarkable.  No fracture or dislocation.     Impression: Normal lumbar spine. THIS DOCUMENT HAS BEEN ELECTRONICALLY SIGNED BY EMMANUEL LLADO MD    Xr Hip Lt W Or Wo Pelv 2-3 Vws    Result Date: 12/09/2016  Exam: Pelvis and left hip, 3 views. Date and time:12/09/2016 1:28 PM  History: Pain S/P fall Comparison: None. Findings: Sacrum, iliac and pubic bone as well as the proximal femur are intact.  Sacroiliac and hip joints are preserved.  No fracture or dislocation.     Impression: No fracture or dislocation. THIS DOCUMENT HAS BEEN ELECTRONICALLY SIGNED BY EMMANUEL LLADO MD       I was personally available for consultation in the emergency department.  I have reviewed the chart and agree with the documentation recorded by the [facility], including the assessment, treatment plan, and disposition.  Arvella Nigh, MD

## 2016-12-09 NOTE — ED Notes (Signed)
Discharge instructions, medications and need for follow up reviewed with pt. Verbalized understanding and ambulatory to waiting room to await medical transportation.

## 2016-12-09 NOTE — Other (Signed)
No answer

## 2016-12-23 ENCOUNTER — Inpatient Hospital Stay
Admit: 2016-12-23 | Discharge: 2016-12-23 | Disposition: A | Discharge status: Home / Self Care | Payer: MEDICAID | Attending: Emergency Medicine

## 2016-12-23 DIAGNOSIS — K219 Gastro-esophageal reflux disease without esophagitis: Secondary | ICD-10-CM

## 2016-12-23 MED ORDER — OMEPRAZOLE 40 MG CAP, DELAYED RELEASE
40 mg | ORAL_CAPSULE | Freq: Every day | ORAL | 1 refills | Status: DC
Start: 2016-12-23 — End: 2016-12-27

## 2016-12-23 MED ORDER — PANTOPRAZOLE 40 MG TAB, DELAYED RELEASE
40 mg | ORAL | Status: AC
Start: 2016-12-23 — End: 2016-12-23
  Administered 2016-12-23: 21:00:00 via ORAL

## 2016-12-23 MED FILL — PANTOPRAZOLE 40 MG TAB, DELAYED RELEASE: 40 mg | ORAL | Qty: 1

## 2016-12-23 NOTE — ED Provider Notes (Signed)
HPI Comments: The pt is ambulatory to the ed, fully awake and alert.  The pt states she has gerd and has epigastic and substernal burning when she burps and eats.  States she was taking omeprazole 40 mg which helps her but ran out 4-5 days ago.  States she has been nauseated and vomited last night    Patient is a 31 y.o. female presenting with epigastric pain. The history is provided by the patient.   Epigastric Pain    Associated symptoms include nausea and vomiting. Pertinent negatives include no fever and no diarrhea.        Past Medical History:   Diagnosis Date   ??? Anxiety    ??? Depression    ??? Gastrointestinal disorder    ??? Psychiatric disorder     depression   ??? PTSD (post-traumatic stress disorder)        Past Surgical History:   Procedure Laterality Date   ??? HX GYN      c-section x 2   ??? HX OTHER SURGICAL      cyst removed from right axilla         History reviewed. No pertinent family history.    Social History     Social History   ??? Marital status: MARRIED     Spouse name: N/A   ??? Number of children: N/A   ??? Years of education: N/A     Occupational History   ??? Not on file.     Social History Main Topics   ??? Smoking status: Former Smoker   ??? Smokeless tobacco: Never Used   ??? Alcohol use Yes      Comment: occasional   ??? Drug use: No   ??? Sexual activity: Not on file     Other Topics Concern   ??? Not on file     Social History Narrative         ALLERGIES: Clindamycin    Review of Systems   Constitutional: Negative.  Negative for chills, diaphoresis and fever.   HENT: Negative.    Eyes: Negative.    Respiratory: Negative.  Negative for chest tightness and shortness of breath.    Cardiovascular: Negative.    Gastrointestinal: Positive for abdominal pain, nausea and vomiting. Negative for diarrhea.   Genitourinary: Negative.    Musculoskeletal: Negative.    Skin: Negative.    Neurological: Negative.    Psychiatric/Behavioral: Negative.        Vitals:    12/23/16 1613   BP: 143/85   Pulse: 88   Resp: 18    Temp: 97 ??F (36.1 ??C)   SpO2: 99%   Weight: 108.9 kg (240 lb)   Height: 5\' 8"  (1.727 m)            Physical Exam   Constitutional: She is oriented to person, place, and time. She appears well-developed and well-nourished. No distress.   HENT:   Head: Normocephalic and atraumatic.   Right Ear: External ear normal.   Left Ear: External ear normal.   Nose: Nose normal.   Mouth/Throat: Oropharynx is clear and moist.   Eyes: Conjunctivae and EOM are normal. Pupils are equal, round, and reactive to light.   Neck: Normal range of motion. Neck supple.   Cardiovascular: Normal rate, regular rhythm, normal heart sounds and intact distal pulses.    Pulmonary/Chest: Effort normal and breath sounds normal.   Abdominal: Soft. Bowel sounds are normal. There is tenderness in the epigastric area. There is  no guarding.       Obese     Musculoskeletal: Normal range of motion.   Neurological: She is alert and oriented to person, place, and time. She has normal reflexes.   Skin: Skin is warm and dry.   Psychiatric: She has a normal mood and affect. Her behavior is normal. Judgment and thought content normal.   Nursing note and vitals reviewed.       MDM  Number of Diagnoses or Management Options  Risk of Complications, Morbidity, and/or Mortality  Presenting problems: low  Diagnostic procedures: low  Management options: low    Patient Progress  Patient progress: stable        ED Course       Procedures    4:23 PM  Visit Vitals   ??? BP 143/85 (BP 1 Location: Left arm, BP Patient Position: At rest)   ??? Pulse 88   ??? Temp 97 ??F (36.1 ??C)   ??? Resp 18   ??? Ht 5\' 8"  (1.727 m)   ??? Wt 108.9 kg (240 lb)   ??? LMP 12/15/2016   ??? SpO2 99%   ??? BMI 36.49 kg/m2       4:34 PM  Will give on week of omeprazole and advised clear fluids and bland diet and to f/u with gi, needs egd done    Dx-gerd

## 2016-12-23 NOTE — ED Notes (Signed)
Pt denies suicidal ideations. Denies chest pain

## 2016-12-23 NOTE — ED Notes (Signed)
Patient discharged home as per MD order.  Pt received education regarding medications, home care and follow up. Opportunity for questions given to patient. Pt offers no concerns at this time and left ED in no distress.

## 2016-12-23 NOTE — ED Triage Notes (Addendum)
C/o burning pains to esophagus x 2 days/ history of acid reflux pt reports self diagnosis  / out of omeprazole last took 5 days / vomited last night

## 2016-12-27 ENCOUNTER — Inpatient Hospital Stay
Admit: 2016-12-27 | Discharge: 2016-12-27 | Disposition: A | Discharge status: Home / Self Care | Payer: MEDICAID | Attending: Emergency Medicine

## 2016-12-27 DIAGNOSIS — R42 Dizziness and giddiness: Secondary | ICD-10-CM

## 2016-12-27 LAB — URINALYSIS W/ RFLX MICROSCOPIC
Bilirubin: NEGATIVE
Glucose: NEGATIVE mg/dL
Ketone: NEGATIVE mg/dL
Leukocyte Esterase: NEGATIVE
Nitrites: NEGATIVE
Protein: NEGATIVE mg/dL
Specific gravity: 1.025
Urobilinogen: 0.2 EU/dL
pH (UA): 6

## 2016-12-27 LAB — HCG URINE, QL: HCG urine, QL: NEGATIVE

## 2016-12-27 LAB — METABOLIC PANEL, COMPREHENSIVE
A-G Ratio: 1.1 (ref 1.0–1.5)
ALT (SGPT): 28 U/L (ref 12–78)
AST (SGOT): 16 U/L (ref 15–37)
Albumin: 3.6 g/dL (ref 3.4–5.0)
Alk. phosphatase: 80 U/L (ref 46–116)
Anion gap: 4 mmol/L (ref 4–12)
BUN: 11 mg/dL (ref 7–18)
Bilirubin, total: 0.3 mg/dL (ref 0.2–1.0)
CO2: 32 mmol/L (ref 21–32)
Calcium: 8.9 mg/dL (ref 8.5–10.1)
Chloride: 104 mmol/L (ref 98–107)
Creatinine: 0.8 mg/dL (ref 0.55–1.02)
GFR est AA: >60 mL/min/{1.73_m2} (ref >60)
GFR est non-AA: >60 mL/min/{1.73_m2} (ref >60)
Globulin: 3.4 g/dL (ref 2.8–3.9)
Glucose: 124 mg/dL — ABNORMAL HIGH (ref 74–106)
Potassium: 3.6 mmol/L (ref 3.5–5.1)
Protein, total: 7 g/dL (ref 6.4–8.2)
Sodium: 140 mmol/L (ref 136–145)

## 2016-12-27 LAB — CBC WITH AUTOMATED DIFF
ABS. BASOPHILS: 0 10*3/uL (ref 0.0–0.1)
ABS. EOSINOPHILS: 0.5 10*3/uL — ABNORMAL HIGH (ref 0.0–0.2)
ABS. LYMPHOCYTES: 2.1 10*3/uL (ref 1.3–3.4)
ABS. MONOCYTES: 0.7 10*3/uL (ref 0.1–1.0)
ABS. NEUTROPHILS: 4 10*3/uL (ref 2.0–8.1)
BASOPHILS: 0 % (ref 0.0–2.5)
EOSINOPHILS: 7 % (ref 0.0–7.0)
HCT: 39.6 % (ref 36.0–48.0)
HGB: 14.1 g/dL (ref 12.0–16.0)
LYMPHOCYTES: 28 % (ref 20.5–51.1)
MCH: 31.6 PG — ABNORMAL HIGH (ref 27.0–31.0)
MCHC: 35.6 g/dL (ref 31.0–37.0)
MCV: 88.8 FL (ref 80.0–100.0)
MONOCYTES: 10 % (ref 0.0–12.0)
MPV: 9.9 FL — ABNORMAL HIGH (ref 6.5–9.5)
NEUTROPHILS: 55 % (ref 42.2–75.2)
PLATELET: 237 10*3/uL (ref 130–400)
RBC: 4.46 M/uL (ref 4.20–5.40)
RDW: 12.7 % (ref 11.5–14.0)
WBC: 7.3 10*3/uL (ref 4.8–11.0)

## 2016-12-27 LAB — URINE MICROSCOPIC

## 2016-12-27 MED ORDER — MECLIZINE 25 MG TAB
25 mg | Freq: Every day | ORAL | Status: DC
Start: 2016-12-27 — End: 2016-12-27

## 2016-12-27 MED ORDER — MECLIZINE 25 MG TAB
25 mg | ORAL | Status: AC
Start: 2016-12-27 — End: 2016-12-27
  Administered 2016-12-27: 22:00:00 via ORAL

## 2016-12-27 MED ORDER — MECLIZINE 25 MG TAB
25 mg | ORAL_TABLET | Freq: Three times a day (TID) | ORAL | 0 refills | Status: DC | PRN
Start: 2016-12-27 — End: 2017-01-05

## 2016-12-27 MED FILL — MECLIZINE 25 MG TAB: 25 mg | ORAL | Qty: 1

## 2016-12-27 NOTE — ED Triage Notes (Signed)
Pt states for the last 3 days she has been nauseous, dizzy and "seeing spots." Pt denies vomiting. Also states decreased appetite.

## 2016-12-27 NOTE — ED Notes (Signed)
Dr. Hmoud back in to speak with pt.  I have reviewed discharge instructions with the patient.  The patient verbalized understanding.  Pt ambulatory to exit.

## 2016-12-27 NOTE — ED Provider Notes (Addendum)
Patient is a 31 y.o. female presenting with dizziness. The history is provided by the patient.   Dizziness    This is a new problem. The current episode started more than 2 days ago. The problem occurs constantly. The problem has not changed since onset.There was no loss of consciousness. Associated symptoms include malaise/fatigue, nausea and dizziness. Pertinent negatives include no visual change, no chest pain, no palpitations, no confusion, no fever, no abdominal pain, no vomiting, no headaches, no back pain, no light-headedness, no seizures and no slurred speech. She has tried nothing for the symptoms. Her past medical history does not include no CVA, no TIA, no DM or no syncope.        Past Medical History:   Diagnosis Date   ??? Anxiety    ??? Depression    ??? Gastrointestinal disorder    ??? Psychiatric disorder     depression   ??? PTSD (post-traumatic stress disorder)        Past Surgical History:   Procedure Laterality Date   ??? HX GYN      c-section x 2   ??? HX OTHER SURGICAL      cyst removed from right axilla         History reviewed. No pertinent family history.    Social History     Social History   ??? Marital status: MARRIED     Spouse name: N/A   ??? Number of children: N/A   ??? Years of education: N/A     Occupational History   ??? Not on file.     Social History Main Topics   ??? Smoking status: Former Smoker   ??? Smokeless tobacco: Never Used   ??? Alcohol use Yes      Comment: occasional   ??? Drug use: No   ??? Sexual activity: Not on file     Other Topics Concern   ??? Not on file     Social History Narrative         ALLERGIES: Clindamycin    Review of Systems   Constitutional: Positive for malaise/fatigue. Negative for chills and fever.   HENT: Negative for sore throat.    Eyes: Negative for discharge, redness and itching.   Respiratory: Negative for cough and shortness of breath.    Cardiovascular: Negative for chest pain and palpitations.   Gastrointestinal: Positive for nausea. Negative for abdominal pain,  diarrhea and vomiting.   Genitourinary: Negative for dysuria and hematuria.   Musculoskeletal: Negative for back pain and neck stiffness.   Skin: Negative for pallor.   Neurological: Positive for dizziness. Negative for seizures, syncope, light-headedness and headaches.   Psychiatric/Behavioral: Negative for confusion.       Vitals:    12/27/16 1753 12/27/16 1812   BP: 139/86 122/72   Pulse: 81 68   Resp: 18 18   Temp: 97 ??F (36.1 ??C)    SpO2: 100% 99%   Weight: 108.9 kg (240 lb)    Height: '5\' 8"'  (1.727 m)             Physical Exam   Constitutional: She is oriented to person, place, and time. She appears well-developed and well-nourished. No distress.   HENT:   Head: Normocephalic and atraumatic.   Eyes: Conjunctivae and EOM are normal. Pupils are equal, round, and reactive to light. Right eye exhibits no discharge. Left eye exhibits no discharge. No scleral icterus.   Neck: Normal range of motion. Neck supple. No JVD present. No  tracheal deviation present.   Cardiovascular: Normal rate, regular rhythm, normal heart sounds and intact distal pulses.  Exam reveals no gallop and no friction rub.    No murmur heard.  Pulmonary/Chest: Effort normal and breath sounds normal. No stridor. No respiratory distress. She has no wheezes. She has no rales.   Abdominal: Soft. Bowel sounds are normal. She exhibits no distension. There is no tenderness.   Musculoskeletal: Normal range of motion. She exhibits no edema or tenderness.   Lymphadenopathy:     She has no cervical adenopathy.   Neurological: She is alert and oriented to person, place, and time. She has normal reflexes. No cranial nerve deficit. She exhibits normal muscle tone. Coordination normal.   Horizontal nystagmus   Skin: Skin is warm. No rash noted. She is not diaphoretic. No erythema. No pallor.   Psychiatric: She has a normal mood and affect. Her behavior is normal. Judgment and thought content normal.   Nursing note and vitals reviewed.       MDM      ED Course        Procedures           Recent Results (from the past 24 hour(s))   EKG, 12 LEAD, INITIAL    Collection Time: 12/27/16  6:05 PM   Result Value Ref Range    Ventricular Rate 70 BPM    Atrial Rate 70 BPM    P-R Interval 154 ms    QRS Duration 76 ms    Q-T Interval 352 ms    QTC Calculation (Bezet) 380 ms    Calculated P Axis 34 degrees    Calculated R Axis 56 degrees    Calculated T Axis 41 degrees    Diagnosis       Normal sinus rhythm with sinus arrhythmia  Normal ECG  When compared with ECG of 20-Sep-2016 11:41,  No significant change was found     URINALYSIS W/ RFLX MICROSCOPIC    Collection Time: 12/27/16  6:06 PM   Result Value Ref Range    Color YELLOW      Appearance CLEAR      Specific gravity 1.025      pH (UA) 6.0      Protein NEGATIVE  mg/dL    Glucose NEGATIVE  mg/dL    Ketone NEGATIVE  mg/dL    Bilirubin NEGATIVE       Blood TRACE      Urobilinogen 0.2 EU/dL    Nitrites NEGATIVE       Leukocyte Esterase NEGATIVE      HCG URINE, QL    Collection Time: 12/27/16  6:06 PM   Result Value Ref Range    HCG urine, QL NEGATIVE  NEG     URINE MICROSCOPIC    Collection Time: 12/27/16  6:06 PM   Result Value Ref Range    WBC NONE /hpf    RBC 0-3 (A) NONE /hpf    Epithelial cells 0-3 /hpf    Bacteria TRACE (A) NONE /hpf    Casts NONE NONE /lpf    Crystals, urine NONE NONE /LPF   CBC WITH AUTOMATED DIFF    Collection Time: 12/27/16  6:15 PM   Result Value Ref Range    WBC 7.3 4.8 - 11.0 K/uL    RBC 4.46 4.20 - 5.40 M/uL    HGB 14.1 12.0 - 16.0 g/dL    HCT 39.6 36.0 - 48.0 %    MCV 88.8 80.0 - 100.0  FL    MCH 31.6 (H) 27.0 - 31.0 PG    MCHC 35.6 31.0 - 37.0 g/dL    RDW 12.7 11.5 - 14.0 %    PLATELET 237 130 - 400 K/uL    MPV 9.9 (H) 6.5 - 9.5 FL    NEUTROPHILS 55 42.2 - 75.2 %    LYMPHOCYTES 28 20.5 - 51.1 %    MONOCYTES 10 0.0 - 12.0 %    EOSINOPHILS 7 0.0 - 7.0 %    BASOPHILS 0 0.0 - 2.5 %    ABS. NEUTROPHILS 4.0 2.0 - 8.1 K/UL    ABS. LYMPHOCYTES 2.1 1.3 - 3.4 K/UL    ABS. MONOCYTES 0.7 0.1 - 1.0 K/UL     ABS. EOSINOPHILS 0.5 (H) 0.0 - 0.2 K/UL    ABS. BASOPHILS 0.0 0.0 - 0.1 K/UL    DF AUTOMATED     METABOLIC PANEL, COMPREHENSIVE    Collection Time: 12/27/16  6:15 PM   Result Value Ref Range    Sodium 140 136 - 145 mmol/L    Potassium 3.6 3.5 - 5.1 mmol/L    Chloride 104 98 - 107 mmol/L    CO2 32 21 - 32 mmol/L    Anion gap 4 4 - 12 mmol/L    Glucose 124 (H) 74 - 106 mg/dL    BUN 11 7 - 18 mg/dL    Creatinine 0.80 0.55 - 1.02 mg/dL    GFR est AA >60 >60 ml/min/1.66m    GFR est non-AA >60 >60 ml/min/1.715m   Calcium 8.9 8.5 - 10.1 mg/dL    Bilirubin, total 0.3 0.2 - 1.0 mg/dL    ALT (SGPT) 28 12 - 78 U/L    AST (SGOT) 16 15 - 37 U/L    Alk. phosphatase 80 46 - 116 U/L    Protein, total 7.0 6.4 - 8.2 g/dL    Albumin 3.6 3.4 - 5.0 g/dL    Globulin 3.4 2.8 - 3.9 g/dL    A-G Ratio 1.1 1.0 - 1.5           <EMERGENCY DEPARTMENT CASE SUMMARY>    Impression/Differential Diagnosis: vertigo, orthostatic, ectopic, arrhythmia    Plan: as per orders    ED Course: stable    Final Impression/Diagnosis: vertigo    Patient condition at time of disposition: stable for d/c home      I have reviewed the following home medications:    Prior to Admission medications    Medication Sig Start Date End Date Taking? Authorizing Provider   buPROPion ([facility]75 mg tablet Take 75 mg by mouth daily.   Yes Phys Other, MD   ibuprofen (MOTRIN) 600 mg tablet Take 1 Tab by mouth every six (6) hours as needed for Pain. 12/09/16  Yes ChPierce Craneosuhene, PA   omeprazole (PRILOSEC) 40 mg capsule Take 40 mg by mouth daily.    Phys Other, MD         TaRocky CraftsMD

## 2016-12-28 LAB — EKG 12-LEAD
Atrial Rate: 70 {beats}/min
Diagnosis: NORMAL
P Axis: 34 degrees
P-R Interval: 154 ms
Q-T Interval: 352 ms
QRS Duration: 76 ms
QTc Calculation (Bazett): 380 ms
R Axis: 56 degrees
T Axis: 41 degrees
Ventricular Rate: 70 {beats}/min

## 2016-12-28 LAB — EKG, 12 LEAD, INITIAL
Atrial Rate: 70 {beats}/min
Calculated P Axis: 34 degrees
Calculated R Axis: 56 degrees
Calculated T Axis: 41 degrees
Diagnosis: NORMAL
P-R Interval: 154 ms
Q-T Interval: 352 ms
QRS Duration: 76 ms
QTC Calculation (Bezet): 380 ms
Ventricular Rate: 70 {beats}/min

## 2017-01-03 ENCOUNTER — Inpatient Hospital Stay
Admit: 2017-01-03 | Discharge: 2017-01-03 | Disposition: A | Discharge status: Home / Self Care | Payer: MEDICAID | Attending: Emergency Medicine

## 2017-01-03 DIAGNOSIS — T814XXA Infection following a procedure, initial encounter: Secondary | ICD-10-CM

## 2017-01-03 MED ORDER — AMOXICILLIN-CLAVULANATE 500 MG-125 MG TAB
500-125 mg | ORAL | Status: AC
Start: 2017-01-03 — End: 2017-01-03
  Administered 2017-01-03: 08:00:00 via ORAL

## 2017-01-03 MED ORDER — AMOXICILLIN CLAVULANATE 875 MG-125 MG TAB
875-125 mg | ORAL_TABLET | Freq: Two times a day (BID) | ORAL | 0 refills | Status: DC
Start: 2017-01-03 — End: 2017-01-05

## 2017-01-03 MED FILL — AMOXICILLIN-CLAVULANATE 500 MG-125 MG TAB: 500-125 mg | ORAL | Qty: 1

## 2017-01-03 NOTE — ED Provider Notes (Signed)
Patient is a 31 y.o. female presenting with abscess. The history is provided by the patient and medical records.   Abscess   This is a new problem. Episode onset: 3 days.        Pt reports she had a cyst of some kind removed from her right axilla about 2 months ago ([facility]  She began to notice a foul odor about 1 week ago and then started having pain in the axilla and noted discharge 3 days ago.            Past Medical History:   Diagnosis Date   ??? Anxiety    ??? Depression    ??? Gastrointestinal disorder    ??? Psychiatric disorder     depression   ??? PTSD (post-traumatic stress disorder)        Past Surgical History:   Procedure Laterality Date   ??? HX GYN      c-section x 2   ??? HX OTHER SURGICAL      cyst removed from right axilla         History reviewed. No pertinent family history.    Social History     Social History   ??? Marital status: MARRIED     Spouse name: N/A   ??? Number of children: N/A   ??? Years of education: N/A     Occupational History   ??? Not on file.     Social History Main Topics   ??? Smoking status: Former Smoker   ??? Smokeless tobacco: Never Used   ??? Alcohol use Yes      Comment: occasional   ??? Drug use: No   ??? Sexual activity: Not on file     Other Topics Concern   ??? Not on file     Social History Narrative         ALLERGIES: Clindamycin    Review of Systems   Constitutional: Negative for chills and fever.   Skin: Positive for rash and wound.   All other systems reviewed and are negative.      Vitals:    01/03/17 0315   BP: 123/80   Pulse: 85   Resp: 20   Temp: 97.8 ??F (36.6 ??C)   SpO2: 96%   Weight: 108.9 kg (240 lb)   Height: 5\' 8"  (1.727 m)            Physical Exam   Constitutional: She is oriented to person, place, and time. She appears well-developed.   obese   Musculoskeletal: Normal range of motion. She exhibits no edema or deformity.   Neurological: She is alert and oriented to person, place, and time.   Skin: Skin is warm and dry.    There is a 3x308mm wound in the axilla with thin, whitish discharge.  + tender to palpation    Psychiatric: She has a normal mood and affect. Her behavior is normal.   Nursing note and vitals reviewed.       MDM      ED Course       Procedures        <EMERGENCY DEPARTMENT CASE SUMMARY>    Plan: There is no palpable fluctuance or mass.  Wound culture swab taken.  Start Augmentin PO.  Pt advised she can use ibuprofen for pain.      ED Course: as noted above    Final Impression/Diagnosis: Wound infection     Patient condition at time of disposition: stable  I have reviewed the following home medications:    Prior to Admission medications    Medication Sig Start Date End Date Taking? Authorizing Provider   ALPRAZolam Prudy Feeler) 0.25 mg tablet Take 0.25 mg by mouth two (2) times daily as needed for Anxiety.   Yes Phys Other, MD   omeprazole (PRILOSEC) 40 mg capsule Take 40 mg by mouth daily.   Yes Phys Other, MD   meclizine (ANTIVERT) 25 mg tablet Take 1 Tab by mouth three (3) times daily as needed for up to 10 days. 12/27/16 01/06/17  Billy Fischer, MD         Leitha Schuller, MD

## 2017-01-03 NOTE — ED Triage Notes (Signed)
Pt presents w/ a c/o pain in right axilla. Pt reports that she had an I&D done in the same area X 2 months ago, onset pain and foul odor X 3 days ago.

## 2017-01-03 NOTE — ED Notes (Signed)
Discharge written / verbal, Rx for Augmentin sent to the pt's pharmacy, pt to f/u with her surgeon asap, pt demonstrates understanding verbally.

## 2017-01-05 ENCOUNTER — Ambulatory Visit: Admit: 2017-01-05 | Discharge: 2017-01-05 | Payer: MEDICAID | Attending: Gastroenterology | Primary: Adolescent Medicine

## 2017-01-05 DIAGNOSIS — K219 Gastro-esophageal reflux disease without esophagitis: Secondary | ICD-10-CM

## 2017-01-05 NOTE — Progress Notes (Signed)
Subjective:      Patient :  Katrina Garza is a 31 y.o. female who presents for   Chief Complaint   Patient presents with   ??? New Patient   31 yr old female states has substernal burning for at least 4 years.  Started taking omeprazole which works well, if does not take it has sx especially with acidic foods. No dysphagia or odynophagia. Some nausea, only vomits with severe gerd.   No diarrhea. No melena or hematochezia. No constipation.   Gets epigastric cramps and bloating.   Patient Active Problem List   Diagnosis Code   ??? Suicide attempt by inadequate means (HCC) X83.8XXA   ??? Depression F32.9   ??? Gastroesophageal reflux disease K21.9   ??? Severe obesity (BMI 35.0-39.9) (HCC) E66.01         Past Medical History:   Diagnosis Date   ??? Acid reflux    ??? Anxiety    ??? Bloating    ??? Depression    ??? Depression    ??? Excessive hunger    ??? Gastrointestinal disorder    ??? GERD (gastroesophageal reflux disease)    ??? Headache    ??? Psychiatric disorder     depression   ??? PTSD (post-traumatic stress disorder)    ??? Shortness of breath     when laying down   ??? Stomach pain      Past Surgical History:   Procedure Laterality Date   ??? HX GYN      c-section x 2   ??? HX OTHER SURGICAL      cyst removed from right axilla     Allergies   Allergen Reactions   ??? Clindamycin Hives     Current Outpatient Prescriptions   Medication Sig Dispense Refill   ??? omeprazole (PRILOSEC) 40 mg capsule Take 40 mg by mouth daily.     ??? ALPRAZolam (XANAX) 0.25 mg tablet Take 0.25 mg by mouth two (2) times daily as needed for Anxiety.       History reviewed. No pertinent family history.  Social History     Social History   ??? Marital status: MARRIED     Spouse name: N/A   ??? Number of children: N/A   ??? Years of education: N/A     Occupational History   ??? Not on file.     Social History Main Topics   ??? Smoking status: Former Smoker   ??? Smokeless tobacco: Never Used   ??? Alcohol use Yes      Comment: occasional   ??? Drug use: Yes     Special: Marijuana    ??? Sexual activity: Not on file     Other Topics Concern   ??? Not on file     Social History Narrative       Visit Vitals   ??? BP 100/70   ??? Pulse (!) 105   ??? Resp 18   ??? Ht 5\' 8"  (1.727 m)   ??? Wt 240 lb (108.9 kg)   ??? LMP 12/15/2016   ??? BMI 36.49 kg/m2        REVIEW OF SYSTEMS:  Review of Systems   Gastrointestinal: Positive for abdominal pain and heartburn.   Neurological: Positive for headaches.   Psychiatric/Behavioral: Positive for depression.       Objective:     PHYSICAL EXAM:  Physical Exam   Constitutional: She is oriented to person, place, and time. She appears well-developed and well-nourished.   HENT:  Head: Normocephalic and atraumatic.   Mouth/Throat: No oropharyngeal exudate.   Eyes: Conjunctivae are normal. Pupils are equal, round, and reactive to light. Right eye exhibits no discharge. Left eye exhibits no discharge. No scleral icterus.   Neck: Neck supple.   Cardiovascular: Normal rate, regular rhythm and normal heart sounds.  Exam reveals no gallop and no friction rub.    No murmur heard.  Pulmonary/Chest: Effort normal and breath sounds normal. No respiratory distress. She has no wheezes. She has no rales.   Abdominal: Soft. Bowel sounds are normal. She exhibits no distension and no mass. There is no tenderness. There is no rebound and no guarding.   Musculoskeletal: She exhibits no edema or tenderness.   Neurological: She is alert and oriented to person, place, and time.   Skin: Skin is warm and dry. No rash noted. No erythema. No pallor.   Psychiatric: She has a normal mood and affect.       Results for orders placed or performed during the hospital encounter of 01/03/17   CULTURE, WOUND W GRAM STAIN   Result Value Ref Range    Special Requests: NO SPECIAL REQUESTS      GRAM STAIN MODERATE WBC'S      GRAM STAIN FEW GRAM POSITIVE COCCI IN PAIRS IN CLUSTERS      GRAM STAIN FEW GRAM POSITIVE RODS      GRAM STAIN RARE GRAM NEGATIVE RODS      Culture result: (A)        RARE STAPHYLOCOCCUS SPECIES IDENTIFICATION AND SUSCEPTIBILITY TO FOLLOW        Assessment/Plan:   Discussed the patient's BMI with her.  I have recommended the following interventions: dietary management education, guidance, and counseling.       The patient was counseled on the dangers of tobacco use.     I attest that current meds have been reviewed and are accurate.     31 yr old female with significant gerd/substernal pain,  epigastric discomfort, some nausea  --cont omeprazole 40mg  which is helping sx.   --schedule EGD

## 2017-01-06 LAB — CULTURE, WOUND W GRAM STAIN

## 2017-01-21 NOTE — Telephone Encounter (Signed)
Attempted to contact pt secondary to multiple Er visits for wound care, wound follow up etc. VM left requesting callback.

## 2017-07-05 ENCOUNTER — Inpatient Hospital Stay: Payer: MEDICAID

## 2017-07-05 ENCOUNTER — Ambulatory Visit

## 2017-07-05 LAB — HCG URINE, QL: HCG urine, QL: NEGATIVE

## 2017-07-05 MED ORDER — LACTATED RINGERS IV
INTRAVENOUS | Status: DC | PRN
Start: 2017-07-05 — End: 2017-07-05
  Administered 2017-07-05: 14:00:00 via INTRAVENOUS

## 2017-07-05 MED ORDER — LACTATED RINGERS IV
INTRAVENOUS | Status: DC
Start: 2017-07-05 — End: 2017-07-05
  Administered 2017-07-05: 13:00:00 via INTRAVENOUS

## 2017-07-05 MED ORDER — PROPOFOL 10 MG/ML IV EMUL
10 mg/mL | INTRAVENOUS | Status: DC | PRN
Start: 2017-07-05 — End: 2017-07-05
  Administered 2017-07-05 (×3): via INTRAVENOUS

## 2017-07-05 MED ORDER — OMEPRAZOLE 40 MG CAP, DELAYED RELEASE
40 mg | ORAL_CAPSULE | Freq: Every day | ORAL | 3 refills | Status: DC
Start: 2017-07-05 — End: 2017-07-09

## 2017-07-05 MED FILL — DIPRIVAN 10 MG/ML INTRAVENOUS EMULSION: 10 mg/mL | INTRAVENOUS | Qty: 250

## 2017-07-05 MED FILL — DIPRIVAN 10 MG/ML INTRAVENOUS EMULSION: 10 mg/mL | INTRAVENOUS | Qty: 150

## 2017-07-05 MED FILL — LACTATED RINGERS IV: INTRAVENOUS | Qty: 1000

## 2017-07-05 NOTE — Procedures (Signed)
See dictated report #[id number] EGD with biopsies of submucosal antra lesion, esophageal and gastric biopsies.

## 2017-07-05 NOTE — Op Note (Signed)
[facility]  [facility]    Melody Comasame:Koerber, Aariel  MR#: [id number]  DOB: 1986/04/03  ACCOUNT #: [id number]   DATE OF SERVICE: 07/05/2017    ANESTHESIA:  Provided by Dr. Celene Krasorma.    INDICATION FOR THE EGD:  History of GERD and substernal pain and epigastric discomfort and nausea.    FINDINGS WERE AS FOLLOWS:  In the esophagus, the Z line was 38 cm from the incisors.  The Z line was irregular with a slightly less than 1 cm salmon-colored tongue present.  Biopsies were taken to rule out Barrett's esophagus.  In the stomach, there was a 1 cm antral submucosal lesion present, which was biopsied using a tunneling technique.  Biopsies were also taken from the antrum and body.  There was possible gastritis present.  Retroflexion was performed in the fundus.  The duodenum was endoscopically normal to the second portion.    ASSESSMENT:  1.  Possible Barrett esophagus, status post biopsies.  2.  Antral submucosal lesion, status post biopsies.  3.  Possible gastritis, status post biopsies from the antrum and body.    PLAN:  I will follow up the biopsy results and the patient will be contacted with the results.  Omeprazole 40 mg daily will be continued.      Verdie DrownKRISTOPHER Lekita Kerekes, MD       KK / AN  D: 07/05/2017 08:55     T: 07/05/2017 09:29  JOB #: [id number]

## 2017-07-05 NOTE — Other (Signed)
MD Korsakoff in to speak with patient, made aware of procedure results with verbal understanding.

## 2017-07-05 NOTE — Op Note (Signed)
[facility]  EGD    Name:Katrina Garza, Katrina Garza  MR#: [id number]  DOB: 03/27/1986  ACCOUNT #: [id number]   DATE OF SERVICE: 07/05/2017    ANESTHESIA:  Provided by Dr. Torma.    INDICATION FOR THE EGD:  History of GERD and substernal pain and epigastric discomfort and nausea.    FINDINGS WERE AS FOLLOWS:  In the esophagus, the Z line was 38 cm from the incisors.  The Z line was irregular with a slightly less than 1 cm salmon-colored tongue present.  Biopsies were taken to rule out Barrett's esophagus.  In the stomach, there was a 1 cm antral submucosal lesion present, which was biopsied using a tunneling technique.  Biopsies were also taken from the antrum and body.  There was possible gastritis present.  Retroflexion was performed in the fundus.  The duodenum was endoscopically normal to the second portion.    ASSESSMENT:  1.  Possible Barrett esophagus, status post biopsies.  2.  Antral submucosal lesion, status post biopsies.  3.  Possible gastritis, status post biopsies from the antrum and body.    PLAN:  I will follow up the biopsy results and the patient will be contacted with the results.  Omeprazole 40 mg daily will be continued.      Warren Lindahl, MD       KK / AN  D: 07/05/2017 08:55     T: 07/05/2017 09:29  JOB #: [id number]

## 2017-07-05 NOTE — Anesthesia Pre-Procedure Evaluation (Signed)
Anesthetic History   No history of anesthetic complications            Review of Systems / Medical History  Patient summary reviewed, nursing notes reviewed and pertinent labs reviewed    Pulmonary          Shortness of breath and smoker         Neuro/Psych         Psychiatric history     Cardiovascular                       GI/Hepatic/Renal     GERD           Endo/Other        Morbid obesity    Comments: Acid reflux (K21.9)    Anxiety (F41.9)    Bloating (R14.0)    Depression (F32.9)    Depression (F32.9)    Excessive hunger (R63.2)    Gastrointestinal disorder (K92.9)    GERD (gastroesophageal reflux disease) (K21.9)    Headache (R51)    Psychiatric disorder (F99)  depression  PTSD (post-traumatic stress disorder) (F43.10)    Shortness of breath (R06.02)  when laying down  Stomach pain (R10.9)       Other Findings                   Anesthetic Plan    ASA: 3  Anesthesia type: MAC          Induction: Intravenous  Anesthetic plan and risks discussed with: Patient

## 2017-07-05 NOTE — Procedures (Signed)
See dictated report #[id number] EGD with biopsies of submucosal antra lesion, esophageal and gastric biopsies.

## 2017-07-05 NOTE — H&P (Signed)
31 yr old female with significant gerd/substernal pain,  epigastric discomfort, some nausea. Seen notes for details.  Vs reviewed  Heent: perl, anicteric  Heart: +s1s2rr  Lungs: cta b/l  Abd: +bs,soft, nt/nd    Plan: EGD.

## 2017-07-05 NOTE — Other (Signed)
Recd patient from Demetrios IsaacsLaura Gordon RN, Pacu patient awake and alert, eating and drinking tolerating well, denies pain.

## 2017-07-05 NOTE — Anesthesia Post-Procedure Evaluation (Signed)
Procedure(s):  ESOPHAGOGASTRODUODENOSCOPY (EGD) BIOPSY.    Anesthesia Post Evaluation      Multimodal analgesia: multimodal analgesia used between 6 hours prior to anesthesia start to PACU discharge  Patient location during evaluation: PACU  Patient participation: complete - patient participated  Level of consciousness: awake  Pain management: adequate  Airway patency: patent  Anesthetic complications: no  Cardiovascular status: acceptable  Respiratory status: acceptable  Hydration status: acceptable  Post anesthesia nausea and vomiting:  none      Visit Vitals  BP 117/72 (BP 1 Location: Left arm, BP Patient Position: At rest)   Pulse 84   Temp 36.8 ??C (98.2 ??F)   Resp 16   Ht 5\' 8"  (1.727 m)   Wt 108.9 kg (240 lb)   SpO2 98%   BMI 36.49 kg/m??

## 2017-07-05 NOTE — Other (Signed)
Patient ready for discharge, all discharge instructions given per MD Korsakoff with verbal understanding, patient denies pain, escorted ambulatory to car, home with husband.

## 2017-07-09 MED ORDER — BISMUTH SUBCIT K 140 MG-METRONIDAZOLE 125 MG-TETRACYCLINE 125 MG CAP
140-125-125 mg | ORAL_CAPSULE | Freq: Four times a day (QID) | ORAL | 0 refills | Status: AC
Start: 2017-07-09 — End: 2017-07-23

## 2017-07-09 MED ORDER — OMEPRAZOLE 40 MG CAP, DELAYED RELEASE
40 mg | ORAL_CAPSULE | Freq: Two times a day (BID) | ORAL | 0 refills | Status: AC
Start: 2017-07-09 — End: 2017-07-23

## 2017-07-09 NOTE — Progress Notes (Signed)
[facility]  EGD  ??  Katrina Garza, Katrina Garza  MR#: [id number]  DOB: January 22, 1986  ACCOUNT #: [id number]   DATE OF SERVICE: 07/05/2017  ??  ANESTHESIA:  Provided by Dr. Celene Krasorma.  ??  INDICATION FOR THE EGD:  History of GERD and substernal pain and epigastric discomfort and nausea.  ??  FINDINGS WERE AS FOLLOWS:  In the esophagus, the Z line was 38 cm from the incisors.  The Z line was irregular with a slightly less than 1 cm salmon-colored tongue present.  Biopsies were taken to rule out Barrett's esophagus.  In the stomach, there was a 1 cm antral submucosal lesion present, which was biopsied using a tunneling technique.  Biopsies were also taken from the antrum and body.  There was possible gastritis present.  Retroflexion was performed in the fundus.  The duodenum was endoscopically normal to the second portion.  ??  ASSESSMENT:  1.  Possible Barrett esophagus, status post biopsies.  2.  Antral submucosal lesion, status post biopsies.  3.  Possible gastritis, status post biopsies from the antrum and body.  ??  PLAN:  I will follow up the biopsy results and the patient will be contacted with the results.  Omeprazole 40 mg daily will be continued.  ??  ??  Katrina DrownKRISTOPHER Rainah Kirshner, MD     ??  KK / AN  D: 07/05/2017 08:55     T: 07/05/2017 09:29  JOB #: [id number]  --------------------------------------------------------------------------  Bx:  A. Stomach, submucosal mass of antrum, biopsy:  ???? ?? Gastric antral mucosa with chronic active gastritis.  ???? ?? Immunostain for H. pylori organisms is positive.  ???? ?? A separate portion of heterotopic pancreatic tissue is also present.  B. Stomach, body, biopsy:  ???? ?? Chronic active gastritis, see comment.  ???? ?? Immunostain for H. pylori organisms is positive.  C. Stomach, antrum, biopsy: ?? ?? Chronic active gastritis.  ???? ?? Immunostain for H. pylori organisms is positive.  D. GE junction, biopsy:  ???? ?? Squamocolumnar junction with reflux esophagitis.   ???? ?? No metaplasia or dysplasia identified.  ????  Comment: Because of the prominent lymphoid infiltrate present in part B,  the case was evaluated by Dr. Concha PyoNicolay Dimov, hematopathologist at [facility] for the possibility of a MALT lymphoma. ??His diagnosis is:   Chronic gastritis. ??No detectable involvement by B-cell lymphoma.     Will tx for h. Pylori with pylera.. Will send for endoscopic ultrasound to evaluate pancreatic rest.  Not sure if this is causing pain and it may need to be resected.  Repeat stool for h. Pylori in 8 weeks.  Results and plan d/w pt.

## 2017-07-09 NOTE — Progress Notes (Signed)
Pt said her insurance is now active.  I told her MA is working on [employer] And will be notified once completed.

## 2017-07-09 NOTE — Progress Notes (Signed)
Pharmacy states pts coverage is not active and MA notified pt  Pt states she will contact [employer] because this is an ongoing issue

## 2017-07-09 NOTE — Progress Notes (Signed)
MA called in Pylera to pt's pharmacy and informed pt that once PA is completed she will be notified

## 2017-07-12 NOTE — Progress Notes (Unsigned)
MA called in the pylera to pts pharmacy    Pharmacy will call back and let MA know if PA will be needed

## 2017-07-14 MED ORDER — AMOXICILLIN 500 MG-CLARITHROMYCIN 500 MG-LANSOPRAZOLE 30 MG COMBO PACK
500-500-30 mg | ORAL_TABLET | ORAL | 0 refills | Status: DC
Start: 2017-07-14 — End: 2017-07-28

## 2017-07-14 NOTE — Addendum Note (Signed)
Addended by: Verdie DrownKORSAKOFF, Terez Freimark on: 07/14/2017 01:06 PM     Modules accepted: Orders

## 2017-07-14 NOTE — Telephone Encounter (Addendum)
Pylera will not be covered   Formulary alternative : amoxicillin or clarithromycin     Ok I will give her the prevpac.  KK

## 2017-07-19 NOTE — Progress Notes (Signed)
Pylera not covered, We sent through [facility], [facility] called and stated not covered but they can do a break down of the medication.    [facility] will break down [employer] and fille RX for her, I told him to cancel the Omeoprazole then.

## 2017-07-28 ENCOUNTER — Inpatient Hospital Stay
Admit: 2017-07-28 | Discharge: 2017-07-28 | Disposition: A | Discharge status: Home / Self Care | Payer: MEDICAID | Attending: Emergency Medicine

## 2017-07-28 DIAGNOSIS — A048 Other specified bacterial intestinal infections: Secondary | ICD-10-CM

## 2017-07-28 LAB — URINALYSIS W/ RFLX MICROSCOPIC
Bilirubin: NEGATIVE
Blood: NEGATIVE
Glucose: NEGATIVE mg/dL
Ketone: NEGATIVE mg/dL
Leukocyte Esterase: NEGATIVE
Nitrites: NEGATIVE
Protein: NEGATIVE mg/dL
Specific gravity: 1.02
Urobilinogen: 0.2 EU/dL
pH (UA): 7

## 2017-07-28 LAB — HCG URINE, QL: HCG urine, QL: NEGATIVE

## 2017-07-28 MED ORDER — ONDANSETRON 4 MG TAB, RAPID DISSOLVE
4 mg | ORAL_TABLET | Freq: Three times a day (TID) | ORAL | 0 refills | Status: DC | PRN
Start: 2017-07-28 — End: 2017-10-07

## 2017-07-28 MED ORDER — ONDANSETRON 4 MG TAB, RAPID DISSOLVE
4 mg | ORAL | Status: AC
Start: 2017-07-28 — End: 2017-07-28
  Administered 2017-07-28: 18:00:00 via ORAL

## 2017-07-28 MED ORDER — AMOXICILLIN 500 MG TABLET
500 mg | ORAL_TABLET | Freq: Two times a day (BID) | ORAL | 0 refills | Status: AC
Start: 2017-07-28 — End: 2017-08-11

## 2017-07-28 MED ORDER — ACETAMINOPHEN-CODEINE 300 MG-30 MG TAB
300-30 mg | ORAL_TABLET | Freq: Four times a day (QID) | ORAL | 0 refills | Status: DC | PRN
Start: 2017-07-28 — End: 2017-10-07

## 2017-07-28 MED ORDER — CLARITHROMYCIN 500 MG TAB
500 mg | ORAL_TABLET | Freq: Two times a day (BID) | ORAL | 0 refills | Status: AC
Start: 2017-07-28 — End: 2017-08-11

## 2017-07-28 MED FILL — ONDANSETRON 4 MG TAB, RAPID DISSOLVE: 4 mg | ORAL | Qty: 1

## 2017-07-28 NOTE — ED Notes (Signed)
Patient medicated as ordered see MAR for time. Patient urine obtained and and sent to lab.

## 2017-07-28 NOTE — ED Triage Notes (Signed)
Complains  Of  gerd  H Pylori and  Lower  Back  Pain   Along  With  Stomach  Pain  Started  A month  Ago    Today  The  Pain  Is  Worse

## 2017-07-28 NOTE — ED Notes (Signed)
Patient discharge instructions given to patient with E-script for Biaxin, Amoxicillin, Zofran, and Tylenol with codeine as prescribed. Follow up with orthopedic for back pain and GI. Patient verbalized understanding. Bland diet. Discharged home via private car.

## 2017-07-28 NOTE — ED Notes (Signed)
Urine specimen cup given patient ambulated to bathroom

## 2017-07-28 NOTE — ED Provider Notes (Signed)
This pt is bib self co about 1 week of lower back pain with sl radiation to both buttocks and also gerd and not able to get meds filled since egd on 12/10 with hpylori infection by dr Arlyss Queen . Pt is on her ppi and also some psych meds for depression but no si and not able to get insurance to pay for her meds despite calling a few time to office .    No blood in vomitius or stool and normal bowel and bladder function.           The history is provided by the patient, medical records and a caregiver.   Back Pain    This is a new problem. The current episode started more than 2 days ago. The problem has not changed since onset.The problem occurs constantly. The pain is present in the lumbar spine. The pain is moderate. Pertinent negatives include no chest pain, no fever, no headaches, no abdominal pain, no bladder incontinence, no dysuria, no paresthesias, no paresis and no tingling.   Nausea    Pertinent negatives include no chills, no fever, no abdominal pain, no headaches, no myalgias, no cough and no headaches.   Epigastric Pain    Associated symptoms include nausea and back pain. Pertinent negatives include no fever, no dysuria, no frequency, no headaches, no myalgias and no chest pain.        Past Medical History:   Diagnosis Date   ??? Acid reflux    ??? Anxiety    ??? Bloating    ??? Depression    ??? Depression    ??? Excessive hunger    ??? Gastrointestinal disorder    ??? GERD (gastroesophageal reflux disease)    ??? Headache    ??? Psychiatric disorder     depression   ??? PTSD (post-traumatic stress disorder)    ??? Shortness of breath     when laying down   ??? Stomach pain        Past Surgical History:   Procedure Laterality Date   ??? CHG ANGRPH CATH F-UP STD TCAT OTHER THAN THROMBYLSIS N/A 07/05/2017    ESOPHAGOGASTRODUODENOSCOPY (EGD) BIOPSY performed by Verdie Drown, MD at [facility] MAIN OR   ??? HX GYN      c-section x 2   ??? HX OTHER SURGICAL      cyst removed from right axilla          History reviewed. No pertinent family history.    Social History     Socioeconomic History   ??? Marital status: MARRIED     Spouse name: Not on file   ??? Number of children: Not on file   ??? Years of education: Not on file   ??? Highest education level: Not on file   Social Needs   ??? Financial resource strain: Not on file   ??? Food insecurity - worry: Not on file   ??? Food insecurity - inability: Not on file   ??? Transportation needs - medical: Not on file   ??? Transportation needs - non-medical: Not on file   Occupational History   ??? Not on file   Tobacco Use   ??? Smoking status: Former Smoker   ??? Smokeless tobacco: Never Used   Substance and Sexual Activity   ??? Alcohol use: Yes     Comment: occasional   ??? Drug use: Yes     Types: Marijuana   ??? Sexual activity: Not on file   Other Topics  Concern   ??? Not on file   Social History Narrative   ??? Not on file         ALLERGIES: Clindamycin    Review of Systems   Constitutional: Negative for chills and fever.   HENT: Negative for congestion and rhinorrhea.    Eyes: Negative for pain and visual disturbance.   Respiratory: Negative for cough and shortness of breath.    Cardiovascular: Negative for chest pain and palpitations.   Gastrointestinal: Positive for nausea. Negative for abdominal pain and blood in stool.   Genitourinary: Negative for bladder incontinence, difficulty urinating, dysuria and frequency.   Musculoskeletal: Positive for back pain. Negative for myalgias.   Skin: Negative for color change and rash.   Neurological: Negative for tingling, seizures, headaches and paresthesias.   Psychiatric/Behavioral: Negative for dysphoric mood and self-injury.       Vitals:    07/28/17 1238   BP: (!) 129/93   Pulse: 98   Resp: 18   Temp: 96.7 ??F (35.9 ??C)   Weight: 113.4 kg (250 lb)   Height: 5\' 8"  (1.727 m)            Physical Exam   Constitutional: She is oriented to person, place, and time. She appears well-developed and well-nourished. No distress.   HENT:    Head: Normocephalic and atraumatic.   Eyes: Conjunctivae are normal. Pupils are equal, round, and reactive to light. No scleral icterus.   Neck: Normal range of motion. Neck supple. No tracheal deviation present.   Cardiovascular: Normal rate, regular rhythm and intact distal pulses.   No murmur heard.  Pulmonary/Chest: Effort normal and breath sounds normal.   Abdominal: Soft. Bowel sounds are normal. She exhibits no distension.   Musculoskeletal: Normal range of motion. She exhibits no edema.   Neurological: She is alert and oriented to person, place, and time. She displays normal reflexes.   Nl slr bilaterally and nl sensatoin both legs and thighs.  Nl patellars and nl achilles  Nl 5/5 bilateral toe dorsi and plantarflexion    Skin: Skin is warm and dry. Capillary refill takes less than 2 seconds. No rash noted. She is not diaphoretic.   Psychiatric: She has a normal mood and affect. Her behavior is normal.   Nursing note and vitals reviewed.       MDM       Procedures    <EMERGENCY DEPARTMENT CASE SUMMARY>  Impression/Differential Diagnosis: gerd due to hpylori nad not treated yet and back pain     Plan:   outpt pt and meds and call dr Arlyss Queenkorsakoff    ED Course:   dw pt and results and gave to pt and dr Arlyss Queenkorsakoff and I agree on plan of care and pt to fu    Final Impression/Diagnosis:   1. Gastroesophageal reflux disease, esophagitis presence not specified    2. H. pylori infection    3. Acute midline low back pain without sciatica          Patient condition at time of disposition: stable     I have reviewed the following home medications:    Prior to Admission medications    Medication Sig Start Date End Date Taking? Authorizing Provider   amoxicillin 500 mg tab Take 1,000 mg by mouth two (2) times a day for 14 days. 07/28/17 08/11/17 Yes Arvella NighVan Roekens, Harlei Lehrmann, MD   clarithromycin (BIAXIN) 500 mg tablet Take 1 Tab by mouth two (2) times a day for 14 days. 07/28/17  08/11/17 Yes Arvella Nigh, MD    acetaminophen-codeine (TYLENOL #3) 300-30 mg per tablet Take 1 Tab by mouth every six (6) hours as needed for Pain (caution sedating). Max Daily Amount: 4 Tabs. 07/28/17  Yes Arvella Nigh, MD   ondansetron (ZOFRAN ODT) 4 mg disintegrating tablet Take 1 Tab by mouth every eight (8) hours as needed for Nausea for up to 10 doses. 07/28/17  Yes Arvella Nigh, MD   escitalopram oxalate (LEXAPRO) 20 mg tablet Take 20 mg by mouth daily.   Yes Provider, Historical   ALPRAZolam (XANAX) 0.25 mg tablet Take 0.25 mg by mouth two (2) times daily as needed for Anxiety.    Other, Phys, MD         Arvella Nigh, MD

## 2017-07-29 NOTE — Progress Notes (Signed)
[facility]:(07/29/2017)  Spoke to pt for follow up from ED visit for back pain, nausea.    Pt reports she is doing much better.  Began taking antibiotics.  Discussed use of PCP. Pt's last visit was a few weeks ago. Will be making f/u apt in the next two weeks.   Assessed for further needs. Reviewed discharge POC. Pt has no questions or Concerns at this time.    Encouraged to schedule f/u w/ PCP.  Provided contact info shall future needs arise.

## 2017-09-30 ENCOUNTER — Encounter: Attending: Internal Medicine | Primary: Adolescent Medicine

## 2017-10-07 ENCOUNTER — Emergency Department: Admit: 2017-10-07 | Payer: MEDICAID | Primary: Adolescent Medicine

## 2017-10-07 ENCOUNTER — Inpatient Hospital Stay
Admit: 2017-10-07 | Discharge: 2017-10-07 | Disposition: A | Discharge status: Home / Self Care | Payer: MEDICAID | Attending: Emergency Medicine

## 2017-10-07 DIAGNOSIS — I889 Nonspecific lymphadenitis, unspecified: Secondary | ICD-10-CM

## 2017-10-07 LAB — CBC WITH AUTOMATED DIFF
ABS. BASOPHILS: 0 10*3/uL (ref 0.0–0.1)
ABS. EOSINOPHILS: 0.3 10*3/uL — ABNORMAL HIGH (ref 0.0–0.2)
ABS. LYMPHOCYTES: 1.5 10*3/uL (ref 1.3–3.4)
ABS. MONOCYTES: 0.3 10*3/uL (ref 0.1–1.0)
ABS. NEUTROPHILS: 2.9 10*3/uL (ref 2.0–8.1)
BASOPHILS: 1 % (ref 0.0–2.5)
EOSINOPHILS: 5 % (ref 0.0–7.0)
HCT: 40.6 % (ref 36.0–48.0)
HGB: 13.6 g/dL (ref 12.0–16.0)
LYMPHOCYTES: 31 % (ref 20.5–51.1)
MCH: 30.3 PG (ref 27.0–31.0)
MCHC: 33.5 g/dL (ref 31.0–37.0)
MCV: 90.4 FL (ref 80.0–100.0)
MONOCYTES: 6 % (ref 0.0–12.0)
MPV: 9.7 FL — ABNORMAL HIGH (ref 6.5–9.5)
NEUTROPHILS: 57 % (ref 42.2–75.2)
PLATELET: 244 10*3/uL (ref 130–400)
RBC: 4.49 M/uL (ref 4.20–5.40)
RDW: 13.3 % (ref 11.5–14.0)
WBC: 5 10*3/uL (ref 4.8–11.0)

## 2017-10-07 MED ORDER — AMOXICILLIN-CLAVULANATE 500 MG-125 MG TAB
500-125 mg | ORAL_TABLET | Freq: Three times a day (TID) | ORAL | 0 refills | Status: AC
Start: 2017-10-07 — End: 2017-10-17

## 2017-10-07 NOTE — ED Notes (Signed)
Moderately sized mass located under chin palpated. Disposition discharge after lab data collected.

## 2017-10-07 NOTE — ED Triage Notes (Signed)
amb to ed for lump below chin x 1 month, skin intact  States went to ED in [place] 1 month ago and was prescribed abx, took one dose and stopped    Pt noted lump increasing in size, denies pain, denies diff breathing, able to eat and drink without incident

## 2017-10-07 NOTE — ED Notes (Signed)
Physical assessment completed. The patient???s level of consciousness is alert. The patient???s mood is calm, cooperative and happy. The patient???s appearance shows normal skin assessment and clean with good hygiene. The patient does not  indicate signs or symptoms of abuse or neglect.PT discharged home with verbal and written instructions regarding medical diagnosis, prescribed medications, dosages and pharmacy preferences. Instructions reviewed with patient and pt verbalized understanding of instructions and discharge plan.   Pt given the opportunity to ask questions. Pt left ED in no distress.

## 2017-10-07 NOTE — ED Notes (Signed)
Result discussed with patient thoroughly. Patient instructed to take all antibiotics as directed and to follow-up with ENT. Instructed to return to ED if difficulty in breathing/swallowing develop. Patient verbalized understanding and left ED in no acute distress.

## 2017-10-07 NOTE — ED Provider Notes (Signed)
This pt with ho depressoin and anxiety , denies si, but is here co gland swelling for last weeks and painful and did not get abx filled. Pt denies fevers or chills.           The history is provided by the patient.   Cyst   This is a new problem. The current episode started more than 1 week ago. The problem occurs constantly. The problem has not changed since onset.Pertinent negatives include no chest pain, no abdominal pain, no headaches and no shortness of breath.        Past Medical History:   Diagnosis Date   ??? Acid reflux    ??? Anxiety    ??? Bloating    ??? Depression    ??? Depression    ??? Excessive hunger    ??? Gastrointestinal disorder    ??? GERD (gastroesophageal reflux disease)    ??? Headache    ??? Psychiatric disorder     depression   ??? PTSD (post-traumatic stress disorder)    ??? Shortness of breath     when laying down   ??? Stomach pain        Past Surgical History:   Procedure Laterality Date   ??? CHG ANGRPH CATH F-UP STD TCAT OTHER THAN THROMBYLSIS N/A 07/05/2017    ESOPHAGOGASTRODUODENOSCOPY (EGD) BIOPSY performed by Verdie DrownKorsakoff, Kristopher[facility]   ??? HX GYN      c-section x 2   ??? HX OTHER SURGICAL      cyst removed from right axilla         History reviewed. No pertinent family history.    Social History     Socioeconomic History   ??? Marital status: MARRIED     Spouse name: Not on file   ??? Number of children: Not on file   ??? Years of education: Not on file   ??? Highest education level: Not on file   Social Needs   ??? Financial resource strain: Not on file   ??? Food insecurity - worry: Not on file   ??? Food insecurity - inability: Not on file   ??? Transportation needs - medical: Not on file   ??? Transportation needs - non-medical: Not on file   Occupational History   ??? Not on file   Tobacco Use   ??? Smoking status: Former Smoker     Packs/day: 0.50   ??? Smokeless tobacco: Never Used   Substance and Sexual Activity   ??? Alcohol use: Yes     Comment: occasional   ??? Drug use: Yes     Types: Marijuana    ??? Sexual activity: Not on file   Other Topics Concern   ??? Not on file   Social History Narrative   ??? Not on file         ALLERGIES: Clindamycin    Review of Systems   Constitutional: Negative for chills and fever.   HENT: Negative for congestion and rhinorrhea.    Eyes: Negative for pain and visual disturbance.   Respiratory: Negative for cough and shortness of breath.    Cardiovascular: Negative for chest pain and palpitations.   Gastrointestinal: Negative for abdominal pain and blood in stool.   Genitourinary: Negative for difficulty urinating, dysuria and frequency.   Musculoskeletal: Negative for back pain and myalgias.   Skin: Negative for color change and rash.   Neurological: Negative for seizures and headaches.   Psychiatric/Behavioral: Negative for dysphoric mood and  self-injury.       Vitals:    10/07/17 0951 10/07/17 0953   BP: (!) 122/109 107/75   Pulse: 86    Resp: 18    Temp: 97 ??F (36.1 ??C)    SpO2: 99%    Weight: 108.9 kg (240 lb)    Height: 5\' 7"  (1.702 m)             Physical Exam   Constitutional: She is oriented to person, place, and time. She appears well-developed and well-nourished. No distress.   HENT:   Head: Normocephalic and atraumatic.   Right Ear: External ear normal.   Left Ear: External ear normal.   Mouth/Throat: No oropharyngeal exudate.   Good dentition , no sublingual abnl.   Swelling 2by3cm sl firm left sided nodule frim ?ln    Eyes: Conjunctivae and EOM are normal. Pupils are equal, round, and reactive to light. No scleral icterus.   Neck: Normal range of motion. Neck supple. No tracheal deviation present.   Cardiovascular: Normal rate and regular rhythm.   No murmur heard.  Pulmonary/Chest: Effort normal and breath sounds normal. No respiratory distress.   Abdominal: Soft. Bowel sounds are normal. There is no tenderness.   Musculoskeletal: Normal range of motion. She exhibits no edema.   Neurological: She is alert and oriented to person, place, and time. No  cranial nerve deficit.   Skin: Skin is warm and dry. Capillary refill takes less than 2 seconds. No rash noted. She is not diaphoretic.   Psychiatric: She has a normal mood and affect. Her behavior is normal.   Nursing note and vitals reviewed.       MDM       Procedures    <EMERGENCY DEPARTMENT CASE SUMMARY>  Impression/Differential Diagnosis: submandibular lymph node swelling     Plan:   Based on my initial history, physical examination, this patient's past history and risk factors, we will obtain diagnostics and based on these and response to therapy will make a decision with best judgement to admit, transfer or discharge this patient with close follow up.  My initial plan is likely to dc with fu     ED Course:   No results found.      Final Impression/Diagnosis: swelling   1. Lymphadenitis        Patient condition at time of disposition: stable    I have reviewed the following home medications:    Prior to Admission medications    Medication Sig Start Date End Date Taking? Authorizing Provider   sertraline (ZOLOFT) 50 mg tablet Take  by mouth daily.   Yes Other, Phys, MD   omeprazole (PRILOSEC) 40 mg capsule Take 40 mg by mouth daily.   Yes Other, Phys, MD   amoxicillin-clavulanate (AUGMENTIN) 500-125 mg per tablet Take 1 Tab by mouth three (3) times daily for 10 days. 10/07/17 10/17/17 Yes Arvella Nigh, MD         Arvella Nigh, MD

## 2017-10-08 NOTE — Progress Notes (Signed)
[facility]:(10/08/2017)  Called pt for follow up from ED visit for lump under chin.    Pt reports feeling better.  Med rec completed. Taking antibiotic.  Discussed use of PCP. Pt has apt tomorrow w/provider and will also ask for ENT referral as pt was referred by ED, but provider does not take insurance.  Reviewed discharge POC. No questions or concerns.    Reminded of proper ED use and concern of frequent ED use.   Encouraged continue f/u and to call PCP for future needs.  Provided contact info shall further needs arise.

## 2017-10-28 DIAGNOSIS — N907 Vulvar cyst: Secondary | ICD-10-CM

## 2017-10-28 NOTE — ED Triage Notes (Signed)
Pt with c/o boil in right groin for 4 days, getting bigger ,painful

## 2017-10-28 NOTE — ED Provider Notes (Signed)
Pt with c/o boil in right groin for 4 days, getting bigger ,painful       Location: right major labium boil  Severity: moderate  Timing:  constant  Modifying Factors: worse  With palpation  Associated Symptoms: none  Context: none  Quality:  Boil/  sore  Duration:  4 days           Past Medical History:   Diagnosis Date   ??? Acid reflux    ??? Anxiety    ??? Bloating    ??? Depression    ??? Depression    ??? Excessive hunger    ??? Gastrointestinal disorder    ??? GERD (gastroesophageal reflux disease)    ??? Headache    ??? Psychiatric disorder     depression   ??? PTSD (post-traumatic stress disorder)    ??? Shortness of breath     when laying down   ??? Stomach pain        Past Surgical History:   Procedure Laterality Date   ??? CHG ANGRPH CATH F-UP STD TCAT OTHER THAN THROMBYLSIS N/A 07/05/2017    ESOPHAGOGASTRODUODENOSCOPY (EGD) BIOPSY performed by Verdie [facility]rown[facility]orsakoff, Kristopher[facility]   ??? HX GYN      c-section x 2   ??? HX OTHER SURGICAL      cyst removed from right axilla         History reviewed. No pertinent family history.    Social History     Socioeconomic History   ??? Marital status: MARRIED     Spouse name: Not on file   ??? Number of children: Not on file   ??? Years of education: Not on file   ??? Highest education level: Not on file   Occupational History   ??? Not on file   Social Needs   ??? Financial resource strain: Not on file   ??? Food insecurity:     Worry: Not on file     Inability: Not on file   ??? Transportation needs:     Medical: Not on file     Non-medical: Not on file   Tobacco Use   ??? Smoking status: Former Smoker     Packs/day: 0.50   ??? Smokeless tobacco: Never Used   Substance and Sexual Activity   ??? Alcohol use: Yes     Comment: occasional   ??? Drug use: Yes     Types: Marijuana   ??? Sexual activity: Not on file   Lifestyle   ??? Physical activity:     Days per week: Not on file     Minutes per session: Not on file   ??? Stress: Not on file   Relationships   ??? Social connections:     Talks on phone: Not on file      Gets together: Not on file     Attends religious service: Not on file     Active member of club or organization: Not on file     Attends meetings of clubs or organizations: Not on file     Relationship status: Not on file   ??? Intimate partner violence:     Fear of current or ex partner: Not on file     Emotionally abused: Not on file     Physically abused: Not on file     Forced sexual activity: Not on file   Other Topics Concern   ??? Not on file   Social History Narrative   ???  Not on file         ALLERGIES: Clindamycin    Review of Systems   Constitutional: Negative for chills and fever.   Genitourinary: Positive for genital sores. Negative for dysuria and vaginal discharge.   Skin:        Cyst labium   All other systems reviewed and are negative.      Vitals:    10/28/17 2339   BP: 123/78   Pulse: (!) 108   Resp: 18   Temp: 97 ??F (36.1 ??C)   SpO2: 99%   Weight: 111.1 kg (245 lb)   Height: 5\' 7"  (1.702 m)            Physical Exam   Constitutional: She is oriented to person, place, and time. She appears well-developed and well-nourished. No distress.   HENT:   Head: Normocephalic and atraumatic.   Eyes: Pupils are equal, round, and reactive to light.   Cardiovascular: Normal rate and regular rhythm.   Pulmonary/Chest: Effort normal and breath sounds normal.   Abdominal: Soft. Bowel sounds are normal.   Genitourinary:   Genitourinary Comments: Right major labium 3 cm abscess/cyst, tender , erythematous   Neurological: She is alert and oriented to person, place, and time.   Skin: Skin is warm and dry.   Nursing note and vitals reviewed.       MDM  Number of Diagnoses or Management Options  Epidermoid cyst of labia majora: new and does not require workup  Risk of Complications, Morbidity, and/or Mortality  Presenting problems: minimal  Diagnostic procedures: low  Management options: low           I&D Abcess Simple  Date/Time: 10/29/2017 1:13 AM  Performed by: Lenis Dickinson, MD  Authorized by: Lenis Dickinson, MD      Consent:     Consent obtained:  Verbal    Consent given by:  Patient    Risks discussed:  Bleeding, incomplete drainage, pain and infection  Location:     Type:  Cyst    Size:  3 cm    Location:  Anogenital    Anogenital location:  Vulva  Pre-procedure details:     Skin preparation:  Betadine  Anesthesia (see MAR for exact dosages):     Anesthesia method:  Local infiltration    Local anesthetic:  Lidocaine 2% w/o epi  Procedure type:     Complexity:  Simple  Procedure details:     Needle aspiration: no      Incision types:  Stab incision    Incision depth:  Subcutaneous    Scalpel blade:  11    Drainage:  Bloody (and whitish cystic material)    Drainage amount:  Scant    Wound treatment:  Wound left open  Post-procedure details:     Patient tolerance of procedure:  Tolerated well, no immediate complications               <EMERGENCY DEPARTMENT CASE SUMMARY>  Impression/Differential Diagnosis: epidermoid cyst    Plan: dc to GYN    ED Course:   Orders Placed This Encounter   ??? I&D ABCESS SIMP   ??? CULTURE, WOUND W GRAM STAIN   ??? ibuprofen (MOTRIN) tablet 600 mg   ??? lidocaine (XYLOCAINE) 20 mg/mL (2 %) injection 100 mg   ??? bacitracin zinc 500 unit/gram packet 1 Packet   ??? trimethoprim-sulfamethoxazole (BACTRIM DS, SEPTRA DS) 160-800 mg per tablet 1 Tab   ??? trimethoprim-sulfamethoxazole (BACTRIM DS) 160-800 mg per  tablet   ??? fluconazole (DIFLUCAN) 150 mg tablet       Final Impression/Diagnosis:   1. Epidermoid cyst of labia majora          Patient condition at time of disposition:   fair    I have reviewed the following home medications:    Prior to Admission medications    Medication Sig Start Date End Date Taking? Authorizing Provider   sertraline (ZOLOFT) 50 mg tablet Take  by mouth daily.   Yes Other, Phys, MD   omeprazole (PRILOSEC) 40 mg capsule Take 40 mg by mouth daily.   Yes Other, Phys, MD         Lenis Dickinson, MD

## 2017-10-28 NOTE — ED Notes (Signed)
Physical assessment completed. The patient???s level of consciousness is alert. The patient???s mood is calm and cooperative. The patient???s appearance shows normal skin assessment. The patient does not  indicate signs or symptoms of abuse or neglect.

## 2017-10-29 ENCOUNTER — Inpatient Hospital Stay
Admit: 2017-10-29 | Discharge: 2017-10-29 | Disposition: A | Discharge status: Home / Self Care | Payer: MEDICAID | Attending: Geriatric Medicine

## 2017-10-29 MED ORDER — LIDOCAINE HCL 2 % (20 MG/ML) IJ SOLN
20 mg/mL (2 %) | INTRAMUSCULAR | Status: AC
Start: 2017-10-29 — End: 2017-10-29
  Administered 2017-10-29: 05:00:00 via INTRADERMAL

## 2017-10-29 MED ORDER — FLUCONAZOLE 150 MG TAB
150 mg | ORAL_TABLET | ORAL | 0 refills | Status: AC
Start: 2017-10-29 — End: 2017-10-29

## 2017-10-29 MED ORDER — BACITRACIN ZINC 500 UNIT/G TOPICAL PACKET
500 unit/gram | Freq: Three times a day (TID) | CUTANEOUS | Status: DC
Start: 2017-10-29 — End: 2017-10-29
  Administered 2017-10-29: 05:00:00 via TOPICAL

## 2017-10-29 MED ORDER — IBUPROFEN 600 MG TAB
600 mg | ORAL | Status: AC
Start: 2017-10-29 — End: 2017-10-29
  Administered 2017-10-29: 04:00:00 via ORAL

## 2017-10-29 MED ORDER — TRIMETHOPRIM-SULFAMETHOXAZOLE 160 MG-800 MG TAB
160-800 mg | ORAL_TABLET | Freq: Two times a day (BID) | ORAL | 0 refills | Status: AC
Start: 2017-10-29 — End: 2017-11-05

## 2017-10-29 MED ORDER — TRIMETHOPRIM-SULFAMETHOXAZOLE 160 MG-800 MG TAB
160-800 mg | ORAL | Status: DC
Start: 2017-10-29 — End: 2017-10-29

## 2017-10-29 MED ORDER — TRIMETHOPRIM-SULFAMETHOXAZOLE 160 MG-800 MG TAB
160-800 mg | ORAL | Status: AC
Start: 2017-10-29 — End: 2017-10-29
  Administered 2017-10-29: 06:00:00 via ORAL

## 2017-10-29 MED FILL — LIDOCAINE HCL 2 % (20 MG/ML) IJ SOLN: 20 mg/mL (2 %) | INTRAMUSCULAR | Qty: 20

## 2017-10-29 MED FILL — TRIMETHOPRIM-SULFAMETHOXAZOLE 160 MG-800 MG TAB: 160-800 mg | ORAL | Qty: 1

## 2017-10-29 MED FILL — IBUPROFEN 600 MG TAB: 600 mg | ORAL | Qty: 1

## 2017-10-29 NOTE — ED Notes (Signed)
I&D of abscess on labia performed by ED provider using local anesthetic Pt tolerated well Culture obtained

## 2017-10-29 NOTE — ED Notes (Signed)
Dr is at bedside to I &D

## 2017-10-29 NOTE — ED Notes (Signed)
Pt was re-eval by ed provider and discharged home in stable condition.  the patient verbalized understanding on discharge,f/u , rx. . Opportunity was provided to ask questions .

## 2017-10-29 NOTE — ED Notes (Signed)
Pt was seen by ed provider

## 2017-10-29 NOTE — Progress Notes (Signed)
Patient was prescribed bactrim

## 2017-10-31 LAB — CULTURE, WOUND W GRAM STAIN

## 2017-11-02 NOTE — Progress Notes (Signed)
[facility]:(11/02/2017)  Called pt for follow up from ED visit for skin problem.    Pt reports she is feeling better.  Discussed importance of f/u care. Pt reports she has an apt w/ PCP for follow up next week.  Reviewed discharge POC. No questions or concerns.    Educated on proper ED use as done in the past.  Strongly encouraged to call PCP for future sick visits in order to prevent further ED use.  Pt has this writers contact info shall further needs arise.

## 2018-02-17 DIAGNOSIS — L02412 Cutaneous abscess of left axilla: Secondary | ICD-10-CM

## 2018-02-17 NOTE — ED Notes (Signed)
Pt with c/o left  under pit cyst for 5 days after shaving

## 2018-02-17 NOTE — ED Provider Notes (Signed)
The history is provided by the patient.   Abscess   This is a new problem. The current episode started more than 2 days ago. The problem occurs rarely. The problem has not changed since onset.Associated symptoms include chest pain. Pertinent negatives include no abdominal pain, no headaches and no shortness of breath. Nothing aggravates the symptoms. She has tried nothing for the symptoms.        Past Medical History:   Diagnosis Date   ??? Acid reflux    ??? Anxiety    ??? Bloating    ??? Depression    ??? Depression    ??? Excessive hunger    ??? Gastrointestinal disorder    ??? GERD (gastroesophageal reflux disease)    ??? Headache    ??? Psychiatric disorder     depression   ??? PTSD (post-traumatic stress disorder)    ??? Shortness of breath     when laying down   ??? Stomach pain        Past Surgical History:   Procedure Laterality Date   ??? CHG ANGRPH CATH F-UP STD TCAT OTHER THAN THROMBYLSIS N/A 07/05/2017    ESOPHAGOGASTRODUODENOSCOPY (EGD) BIOPSY performed [facility]orsakoff[facility] Krist[facility]   ??? HX GYN      c-section x 2   ??? HX OTHER SURGICAL      cyst removed from right axilla         No family history on file.    Social History     Socioeconomic History   ??? Marital status: MARRIED     Spouse name: Not on file   ??? Number of children: Not on file   ??? Years of education: Not on file   ??? Highest education level: Not on file   Occupational History   ??? Not on file   Social Needs   ??? Financial resource strain: Not on file   ??? Food insecurity:     Worry: Not on file     Inability: Not on file   ??? Transportation needs:     Medical: Not on file     Non-medical: Not on file   Tobacco Use   ??? Smoking status: Former Smoker     Packs/day: 0.50   ??? Smokeless tobacco: Never Used   Substance and Sexual Activity   ??? Alcohol use: Yes     Comment: occasional   ??? Drug use: Yes     Types: Marijuana   ??? Sexual activity: Not on file   Lifestyle   ??? Physical activity:     Days per week: Not on file     Minutes per session: Not on file   ??? Stress:  Not on file   Relationships   ??? Social connections:     Talks on phone: Not on file     Gets together: Not on file     Attends religious service: Not on file     Active member of club or organization: Not on file     Attends meetings of clubs or organizations: Not on file     Relationship status: Not on file   ??? Intimate partner violence:     Fear of current or ex partner: Not on file     Emotionally abused: Not on file     Physically abused: Not on file     Forced sexual activity: Not on file   Other Topics Concern   ??? Not on file  Social History Narrative   ??? Not on file         ALLERGIES: Clindamycin    Review of Systems   Constitutional: Negative for chills and fever.   HENT: Negative for sore throat.    Eyes: Negative for discharge, redness and itching.   Respiratory: Negative for cough and shortness of breath.    Cardiovascular: Positive for chest pain. Negative for palpitations.   Gastrointestinal: Negative for abdominal pain, diarrhea, nausea and vomiting.   Genitourinary: Negative for dysuria and hematuria.   Musculoskeletal: Negative for back pain and neck stiffness.   Skin: Negative for pallor.   Neurological: Negative for dizziness, syncope, light-headedness and headaches.       Vitals:    02/17/18 2327   BP: 123/76   Pulse: 98   Resp: 18   Temp: 98 ??F (36.7 ??C)   SpO2: 100%   Weight: 111.1 kg (245 lb)   Height: 5\' 7"  (1.702 m)            Physical Exam   Constitutional: She is oriented to person, place, and time. She appears well-developed and well-nourished. No distress.   HENT:   Head: Normocephalic and atraumatic.   Mouth/Throat: Oropharynx is clear and moist. No oropharyngeal exudate.   Eyes: Pupils are equal, round, and reactive to light. Conjunctivae and EOM are normal. Right eye exhibits no discharge. Left eye exhibits no discharge.   Neck: Normal range of motion. Neck supple. No JVD present.   Cardiovascular: Normal rate, regular rhythm, normal heart sounds and intact distal pulses. Exam reveals  no gallop and no friction rub.   No murmur heard.  Pulmonary/Chest: Effort normal and breath sounds normal. No stridor. She has no wheezes.   Abdominal: Soft. Bowel sounds are normal. She exhibits no distension. There is no tenderness.   Musculoskeletal: Normal range of motion. She exhibits no edema or deformity.   Lymphadenopathy:     She has no cervical adenopathy.   Neurological: She is alert and oriented to person, place, and time. No cranial nerve deficit. Coordination normal.   Skin: Skin is warm. Capillary refill takes less than 2 seconds. No rash noted. She is not diaphoretic. No erythema. No pallor.   Lt axillary abscess with hydradenitis   Psychiatric: She has a normal mood and affect. Her behavior is normal. Judgment and thought content normal.   Nursing note and vitals reviewed.       MDM       I&D Abcess Simple  Date/Time: 02/18/2018 12:11 AM  Performed by: Billy Fischer, MD  Authorized by: Billy Fischer, MD     Consent:     Consent obtained:  Verbal    Consent given by:  Patient  Location:     Type:  Abscess    Location:  Upper extremity    Upper extremity location:  Arm    Arm location: left axilla.  Pre-procedure details:     Skin preparation:  Antiseptic wash  Anesthesia (see MAR for exact dosages):     Anesthesia method:  Local infiltration    Local anesthetic:  Lidocaine 2% WITH epi  Procedure type:     Complexity:  Simple  Procedure details:     Needle aspiration: no      Incision types:  Single straight    Incision depth:  Subcutaneous    Scalpel blade:  11    Wound management:  Probed and deloculated    Drainage:  Bloody    Drainage amount:  Moderate    Wound treatment:  Wound left open    Packing materials:  None  Post-procedure details:     Patient tolerance of procedure:  Tolerated well, no immediate complications          <EMERGENCY DEPARTMENT CASE SUMMARY>    Impression/Differential Diagnosis: axillary abscess, hydradenitis     Plan: I & D    ED Course: stable    Final Impression/Diagnosis:  as above    Patient condition at time of disposition: stable for d/c home      I have reviewed the following home medications:    Prior to Admission medications    Medication Sig Start Date End Date Taking? Authorizing Provider   trimethoprim-sulfamethoxazole (BACTRIM DS, SEPTRA DS) 160-800 mg per tablet Take 1 Tab by mouth two (2) times a day for 10 days. 02/18/18 02/28/18 Yes Saron Tweed, MD   pantoprazole (PROTONIX) 40 mg tablet Take 40 mg by mouth daily.   Yes Other, Phys, MD   sertraline (ZOLOFT) 50 mg tablet Take  by mouth daily.   Yes Other, Phys, MD         Billy Fischer, MD

## 2018-02-17 NOTE — ED Triage Notes (Addendum)
Pt with c/o left  under pit cyst for 5 days after shaving

## 2018-02-17 NOTE — ED Provider Notes (Signed)
The history is provided by the patient.   Abscess   This is a new problem. The current episode started more than 2 days ago. The problem occurs rarely. The problem has not changed since onset.Associated symptoms include chest pain. Pertinent negatives include no abdominal pain, no headaches and no shortness of breath. Nothing aggravates the symptoms. She has tried nothing for the symptoms.        Past Medical History:   Diagnosis Date   ??? Acid reflux    ??? Anxiety    ??? Bloating    ??? Depression    ??? Depression    ??? Excessive hunger    ??? Gastrointestinal disorder    ??? GERD (gastroesophageal reflux disease)    ??? Headache    ??? Psychiatric disorder     depression   ??? PTSD (post-traumatic stress disorder)    ??? Shortness of breath     when laying down   ??? Stomach pain        Past Surgical History:   Procedure Laterality Date   ??? CHG ANGRPH CATH F-UP STD TCAT OTHER THAN THROMBYLSIS N/A 07/05/2017    ESOPHAGOGASTRODUODENOSCOPY (EGD) BIOPSY performed by Verdie Drown, MD at [facility] OR   ??? HX GYN      c-section x 2   ??? HX OTHER SURGICAL      cyst removed from right axilla         No family history on file.    Social History     Socioeconomic History   ??? Marital status: MARRIED     Spouse name: Not on file   ??? Number of children: Not on file   ??? Years of education: Not on file   ??? Highest education level: Not on file   Occupational History   ??? Not on file   Social Needs   ??? Financial resource strain: Not on file   ??? Food insecurity:     Worry: Not on file     Inability: Not on file   ??? Transportation needs:     Medical: Not on file     Non-medical: Not on file   Tobacco Use   ??? Smoking status: Former Smoker     Packs/day: 0.50   ??? Smokeless tobacco: Never Used   Substance and Sexual Activity   ??? Alcohol use: Yes     Comment: occasional   ??? Drug use: Yes     Types: Marijuana   ??? Sexual activity: Not on file   Lifestyle   ??? Physical activity:     Days per week: Not on file     Minutes per session: Not on file    ??? Stress: Not on file   Relationships   ??? Social connections:     Talks on phone: Not on file     Gets together: Not on file     Attends religious service: Not on file     Active member of club or organization: Not on file     Attends meetings of clubs or organizations: Not on file     Relationship status: Not on file   ??? Intimate partner violence:     Fear of current or ex partner: Not on file     Emotionally abused: Not on file     Physically abused: Not on file     Forced sexual activity: Not on file   Other Topics Concern   ??? Not on file  Social History Narrative   ??? Not on file         ALLERGIES: Clindamycin    Review of Systems   Constitutional: Negative for chills and fever.   HENT: Negative for sore throat.    Eyes: Negative for discharge, redness and itching.   Respiratory: Negative for cough and shortness of breath.    Cardiovascular: Positive for chest pain. Negative for palpitations.   Gastrointestinal: Negative for abdominal pain, diarrhea, nausea and vomiting.   Genitourinary: Negative for dysuria and hematuria.   Musculoskeletal: Negative for back pain and neck stiffness.   Skin: Negative for pallor.   Neurological: Negative for dizziness, syncope, light-headedness and headaches.       Vitals:    02/17/18 2327   BP: 123/76   Pulse: 98   Resp: 18   Temp: 98 ??F (36.7 ??C)   SpO2: 100%   Weight: 111.1 kg (245 lb)   Height: 5\' 7"  (1.702 m)            Physical Exam   Constitutional: She is oriented to person, place, and time. She appears well-developed and well-nourished. No distress.   HENT:   Head: Normocephalic and atraumatic.   Mouth/Throat: Oropharynx is clear and moist. No oropharyngeal exudate.   Eyes: Pupils are equal, round, and reactive to light. Conjunctivae and EOM are normal. Right eye exhibits no discharge. Left eye exhibits no discharge.   Neck: Normal range of motion. Neck supple. No JVD present.   Cardiovascular: Normal rate, regular rhythm, normal heart sounds and  intact distal pulses. Exam reveals no gallop and no friction rub.   No murmur heard.  Pulmonary/Chest: Effort normal and breath sounds normal. No stridor. She has no wheezes.   Abdominal: Soft. Bowel sounds are normal. She exhibits no distension. There is no tenderness.   Musculoskeletal: Normal range of motion. She exhibits no edema or deformity.   Lymphadenopathy:     She has no cervical adenopathy.   Neurological: She is alert and oriented to person, place, and time. No cranial nerve deficit. Coordination normal.   Skin: Skin is warm. Capillary refill takes less than 2 seconds. No rash noted. She is not diaphoretic. No erythema. No pallor.   Lt axillary abscess with hydradenitis   Psychiatric: She has a normal mood and affect. Her behavior is normal. Judgment and thought content normal.   Nursing note and vitals reviewed.       MDM       I&D Abcess Simple  Date/Time: 02/18/2018 12:11 AM  Performed by: Billy Fischer, MD  Authorized by: Billy Fischer, MD     Consent:     Consent obtained:  Verbal    Consent given by:  Patient  Location:     Type:  Abscess    Location:  Upper extremity    Upper extremity location:  Arm    Arm location: left axilla.  Pre-procedure details:     Skin preparation:  Antiseptic wash  Anesthesia (see MAR for exact dosages):     Anesthesia method:  Local infiltration    Local anesthetic:  Lidocaine 2% WITH epi  Procedure type:     Complexity:  Simple  Procedure details:     Needle aspiration: no      Incision types:  Single straight    Incision depth:  Subcutaneous    Scalpel blade:  11    Wound management:  Probed and deloculated    Drainage:  Bloody    Drainage amount:  Moderate    Wound treatment:  Wound left open    Packing materials:  None  Post-procedure details:     Patient tolerance of procedure:  Tolerated well, no immediate complications          <EMERGENCY DEPARTMENT CASE SUMMARY>    Impression/Differential Diagnosis: axillary abscess, hydradenitis     Plan: I & D     ED Course: stable    Final Impression/Diagnosis: as above    Patient condition at time of disposition: stable for d/c home      I have reviewed the following home medications:    Prior to Admission medications    Medication Sig Start Date End Date Taking? Authorizing Provider   trimethoprim-sulfamethoxazole (BACTRIM DS, SEPTRA DS) 160-800 mg per tablet Take 1 Tab by mouth two (2) times a day for 10 days. 02/18/18 02/28/18 Yes Euel Castile, MD   pantoprazole (PROTONIX) 40 mg tablet Take 40 mg by mouth daily.   Yes Other, Phys, MD   sertraline (ZOLOFT) 50 mg tablet Take  by mouth daily.   Yes Other, Phys, MD         Billy Fischeralat Cassadee Vanzandt, MD

## 2018-02-18 ENCOUNTER — Inpatient Hospital Stay
Admit: 2018-02-18 | Discharge: 2018-02-18 | Disposition: A | Discharge status: Home / Self Care | Payer: MEDICAID | Attending: Emergency Medicine

## 2018-02-18 MED ORDER — IBUPROFEN 600 MG TAB
600 mg | ORAL | Status: AC
Start: 2018-02-18 — End: 2018-02-18
  Administered 2018-02-18: 04:00:00 via ORAL

## 2018-02-18 MED ORDER — TRIMETHOPRIM-SULFAMETHOXAZOLE 160 MG-800 MG TAB
160-800 mg | ORAL | Status: AC
Start: 2018-02-18 — End: 2018-02-18
  Administered 2018-02-18: 04:00:00 via ORAL

## 2018-02-18 MED ORDER — LIDOCAINE-EPINEPHRINE PF 2 %-1:200,000 IJ SOLN
2 %-1:00,000 | Freq: Once | INTRAMUSCULAR | Status: AC
Start: 2018-02-18 — End: 2018-02-17
  Administered 2018-02-18: 04:00:00 via SUBCUTANEOUS

## 2018-02-18 MED ORDER — TRIMETHOPRIM-SULFAMETHOXAZOLE 160 MG-800 MG TAB
160-800 mg | ORAL_TABLET | Freq: Two times a day (BID) | ORAL | 0 refills | Status: AC
Start: 2018-02-18 — End: 2018-02-28

## 2018-02-18 MED FILL — TRIMETHOPRIM-SULFAMETHOXAZOLE 160 MG-800 MG TAB: 160-800 mg | ORAL | Qty: 1

## 2018-02-18 MED FILL — IBUPROFEN 600 MG TAB: 600 mg | ORAL | Qty: 1

## 2018-02-18 MED FILL — XYLOCAINE-MPF/EPINEPHRINE 2 %-1:200,000 INJECTION SOLUTION: 2 %-1:00,000 | INTRAMUSCULAR | Qty: 20

## 2018-02-18 NOTE — ED Notes (Signed)
Call from [facility] at [facility] indicates that the pt wound culture from 02/18/18 was positive for mrsa.  The chart indicates that the pt was started on bactrim ds and will f/u with her pmd

## 2018-02-18 NOTE — ED Notes (Signed)
I &d was done by dr Tonia Ghenthmoud  Culture was collected   Pt was  discharged home in stable condition,mediated as ordered prior d/c .  the patient verbalized understanding on discharge,f/u , rx. . Opportunity was provided to ask questions .

## 2018-02-18 NOTE — ED Notes (Signed)
 Physical assessment completed. The patient's level of consciousness is alert. The patient's mood is calm. The patient's appearance shows edema left arm pit . The patient does not  indicate signs or symptoms of abuse or neglect.

## 2018-02-18 NOTE — ED Notes (Signed)
I &d was done by dr hmoud  Culture was collected   Pt was  discharged home in stable condition,mediated as ordered prior d/c .  the patient verbalized understanding on discharge,f/u , rx. . Opportunity was provided to ask questions .

## 2018-02-18 NOTE — ED Notes (Signed)
Physical assessment completed. The patient???s level of consciousness is alert. The patient???s mood is calm. The patient???s appearance shows edema left arm pit . The patient does not  indicate signs or symptoms of abuse or neglect.

## 2018-02-18 NOTE — ED Notes (Signed)
Call from [facility] at [facility] indicates that the pt wound culture from 02/18/18 was positive for mrsa.  The chart indicates that the pt was started on bactrim ds and will f/u with her pmd

## 2018-02-21 LAB — CULTURE, WOUND W GRAM STAIN
GRAM STAIN: NONE SEEN
Gram Stain Result: NONE SEEN

## 2018-02-22 LAB — CULTURE, ANAEROBIC

## 2018-05-17 ENCOUNTER — Inpatient Hospital Stay
Admit: 2018-05-17 | Discharge: 2018-05-17 | Disposition: A | Discharge status: Home / Self Care | Payer: MEDICAID | Attending: Emergency Medicine

## 2018-05-17 ENCOUNTER — Emergency Department: Admit: 2018-05-17 | Payer: MEDICAID | Primary: Adolescent Medicine

## 2018-05-17 DIAGNOSIS — M79645 Pain in left finger(s): Secondary | ICD-10-CM

## 2018-05-17 LAB — URINALYSIS W/ RFLX MICROSCOPIC
Bilirubin, Urine: NEGATIVE
Bilirubin: NEGATIVE
Glucose, Ur: NEGATIVE mg/dL
Glucose: NEGATIVE mg/dL
Ketone: NEGATIVE mg/dL
Ketones, Urine: NEGATIVE mg/dL
Nitrite, Urine: NEGATIVE
Nitrites: NEGATIVE
Protein, UA: NEGATIVE mg/dL
Protein: NEGATIVE mg/dL
Specific Gravity, UA: 1.025
Specific gravity: 1.025
Urobilinogen, UA, POCT: 1 EU/dL
Urobilinogen: 1 EU/dL
pH (UA): 7
pH, UA: 7

## 2018-05-17 LAB — URINE MICROSCOPIC

## 2018-05-17 LAB — HCG URINE, QL
HCG urine, QL: NEGATIVE
Pregnancy Test(Urn): NEGATIVE

## 2018-05-17 MED ORDER — IBUPROFEN 600 MG TAB
600 mg | ORAL_TABLET | Freq: Four times a day (QID) | ORAL | 0 refills | Status: AC | PRN
Start: 2018-05-17 — End: ?

## 2018-05-17 MED ORDER — AZITHROMYCIN 250 MG TAB
250 mg | ORAL | Status: AC
Start: 2018-05-17 — End: 2018-05-17
  Administered 2018-05-17: 16:00:00 via ORAL

## 2018-05-17 MED ORDER — METRONIDAZOLE 500 MG TAB
500 mg | ORAL_TABLET | ORAL | 0 refills | Status: AC
Start: 2018-05-17 — End: 2018-05-17

## 2018-05-17 MED ORDER — ACYCLOVIR 800 MG TAB
800 mg | ORAL_TABLET | Freq: Every day | ORAL | 0 refills | Status: AC
Start: 2018-05-17 — End: 2018-05-24

## 2018-05-17 MED ORDER — ACETAMINOPHEN 325 MG TABLET
325 mg | ORAL_TABLET | ORAL | 0 refills | Status: AC | PRN
Start: 2018-05-17 — End: ?

## 2018-05-17 MED ORDER — NITROFURANTOIN (25% MACROCRYSTAL FORM) 100 MG CAP
100 mg | ORAL_CAPSULE | Freq: Two times a day (BID) | ORAL | 0 refills | Status: AC
Start: 2018-05-17 — End: 2018-05-27

## 2018-05-17 MED ORDER — LIDOCAINE (PF) 10 MG/ML (1 %) IJ SOLN
10 mg/mL (1 %) | INTRAMUSCULAR | Status: AC
Start: 2018-05-17 — End: 2018-05-17
  Administered 2018-05-17: 16:00:00 via INTRAMUSCULAR

## 2018-05-17 MED FILL — CEFTRIAXONE 250 MG SOLUTION FOR INJECTION: 250 mg | INTRAMUSCULAR | Qty: 250

## 2018-05-17 MED FILL — AZITHROMYCIN 250 MG TAB: 250 mg | ORAL | Qty: 4

## 2018-05-17 NOTE — ED Provider Notes (Signed)
ED Provider Notes by Judith Part, NP at 05/17/18 1124                Author: Judith Part, NP  Service: Emergency Medicine  Author Type: Nurse Practitioner       Filed: 05/21/18 1135  Date of Service: 05/17/18 1124  Status: Attested Addendum          Editor: Judith Part, NP (Nurse Practitioner)       Related Notes: Original Note by Judith Part, NP (Nurse Practitioner) filed at 05/17/18 1220          Cosigner: Arvella Nigh, MD at 05/21/18 1142          Attestation signed by Arvella Nigh, MD at 05/21/18 1142          I was personally available for consultation in the emergency department.  I have reviewed the chart and agree with the documentation recorded by the [facility], including  the assessment, treatment plan, and disposition.   Arvella Nigh, MD                                    HPI Middle finger pain for one month to left hand MIP greatest, smashed in car door, deformed with knot, turned medial according  to patient, full ROM, intermittent aching pain with movement, no open area current or at time of accident, vaginal DC for 2 days, found out husband is sleeping with men. Has 2 children age 57 and 65.  IUD, denies pregnancy, G2 P2 A0, denies vaginal odor,  increased DC, unaware of color, no hx STD, no hx BV or yeast, c/o severe vaginal burning and itching, dysuria, no back pain, no vaginal pain. No missed or abnormal periods.  No bone or joint  DO, no bleeding or coagulation DO, no connective tissue DO. No numbness or tingling, no coolness or color change.  No locking, popping, or crepitus.  Pain worse to finger  with movement and palpation. Last pap exam over a year ago.         Past Medical History:        Diagnosis  Date         ?  Acid reflux       ?  Anxiety       ?  Bloating       ?  Depression       ?  Depression       ?  Excessive hunger       ?  Gastrointestinal disorder       ?  GERD (gastroesophageal reflux disease)       ?  Headache       ?  Psychiatric disorder             depression         ?  PTSD (post-traumatic stress disorder)       ?  Shortness of breath            when laying down         ?  Stomach pain               Past Surgical History:         Procedure  Laterality  Date          ?  CHG ANGRPH CATH F-UP STD TCAT OTHER THAN THROMBYLSIS  N/A  07/05/2017          ESOPHAGOGASTRODUODENOSCOPY (EGD) BIOPSY performed by Verdie Drown, MD at [facility] MAIN OR          ?  HX GYN              c-section x 2          ?  HX OTHER SURGICAL              cyst removed from right axilla             History reviewed. No pertinent family history.        Social History          Socioeconomic History         ?  Marital status:  MARRIED              Spouse name:  Not on file         ?  Number of children:  Not on file     ?  Years of education:  Not on file     ?  Highest education level:  Not on file       Occupational History        ?  Not on file       Social Needs         ?  Financial resource strain:  Not on file        ?  Food insecurity:              Worry:  Not on file         Inability:  Not on file        ?  Transportation needs:              Medical:  Not on file         Non-medical:  Not on file       Tobacco Use         ?  Smoking status:  Former Smoker              Packs/day:  0.50         ?  Smokeless tobacco:  Never Used       Substance and Sexual Activity         ?  Alcohol use:  Yes             Comment: occasional         ?  Drug use:  Yes              Types:  Marijuana         ?  Sexual activity:  Not on file       Lifestyle        ?  Physical activity:              Days per week:  Not on file         Minutes per session:  Not on file         ?  Stress:  Not on file       Relationships        ?  Social connections:              Talks on phone:  Not on file         Gets together:  Not on file         Attends religious service:  Not on  file         Active member of club or organization:  Not on file         Attends meetings of clubs or organizations:  Not on file          Relationship status:  Not on file        ?  Intimate partner violence:              Fear of current or ex partner:  Not on file         Emotionally abused:  Not on file         Physically abused:  Not on file         Forced sexual activity:  Not on file        Other Topics  Concern        ?  Not on file       Social History Narrative        ?  Not on file              ALLERGIES: Clindamycin      Review of Systems    Constitutional: Negative.  Negative for chills and fever.    HENT: Negative.  Negative for congestion, ear pain, rhinorrhea and sore throat.     Eyes: Negative.  Negative for pain and visual disturbance.    Respiratory: Negative.  Negative for cough and shortness of breath.     Cardiovascular: Negative.  Negative for chest pain and palpitations.    Gastrointestinal: Negative.  Negative for abdominal pain and blood in stool.    Genitourinary: Positive for difficulty urinating, dysuria , genital sores and vaginal discharge . Negative for decreased urine volume, dyspareunia, enuresis, flank pain, frequency, hematuria, menstrual problem, pelvic pain, urgency, vaginal bleeding and vaginal pain.    Musculoskeletal: Negative.  Negative for back pain and myalgias.    Skin: Negative.  Negative for color change and rash.    Allergic/Immunologic: Negative.  Negative for immunocompromised state.    Neurological: Negative.  Negative for seizures and headaches.    Hematological: Negative.  Does not bruise/bleed easily.    Psychiatric/Behavioral: Negative.  Negative for dysphoric mood and self-injury.    All other systems reviewed and are negative.           Vitals:          05/17/18 1053        BP:  132/89     Pulse:  80     Resp:  18     Temp:  96.8 ??F (36 ??C)     SpO2:  100%     Weight:  106.6 kg (235 lb)        Height:  5\' 8"  (1.727 m)                Physical Exam    Constitutional: She is oriented to person, place, and time. She appears well-developed and well-nourished. No distress.    HENT:    Head:  Normocephalic and atraumatic.   Right Ear: External ear normal.   Left Ear: External ear normal.    Nose: Nose normal.    Mouth/Throat: Oropharynx is clear and moist. No oropharyngeal exudate.    Eyes: Pupils are equal, round, and reactive to light. Conjunctivae and EOM are normal. No scleral icterus.    Neck: Normal range of motion. Neck supple. No tracheal deviation present.    Cardiovascular: Normal rate, regular rhythm, normal heart  sounds and intact distal pulses. Exam reveals no gallop and no friction rub.    No murmur heard.   Pulmonary/Chest: Effort normal and breath sounds normal. No stridor. No respiratory distress. She has no wheezes. She has no rales. She exhibits no tenderness.    Abdominal: Soft. Bowel sounds are normal. She exhibits no distension and no mass. There is tenderness. There is no rebound and no guarding.  No hernia.   Bladder tenderness with direct palpation, no CVA tenderness, negative Murphy's, negative McBurney's, negative psoas, negative obturator    Genitourinary: Uterus normal.   Genitourinary Comments: Pt  with one active vesicular 1/4 cm clear fluid filled lesion to left external labia, right labia with scabbed, crusted lesion and linear abrasions consistent with scratching, no active bleeding or DC. Mild edema and redness,  Vaginal opening with redness,  and yellow green DC, cervix with redness and inflammation with active yellow green DC, no obvious odor, no CMT, no adnexal tenderness, no tenderness with direct palpation to uterus and ovaries bilateral, GC/Chlam collected, Nancy RN chaperone at bedside  for pelvic and bimanual exam   Musculoskeletal: Normal range of motion.  She exhibits no edema.   Neurological: She is alert and oriented to person, place, and time.    Skin: Skin is warm and dry. No rash noted. She is not diaphoretic.   Psychiatric: She has a normal mood and affect. Her behavior  is normal.    Nursing note and vitals reviewed.          MDM   Number of Diagnoses  or Management Options   Finger pain, left: new  and requires workup   Herpes simplex vulvovaginitis: new and requires workup   Possible exposure to STD: new and requires workup   Vaginal discharge: new and requires workup             Procedures UPT, UA, reviewed      Urine culture and GC/Chlam       Left finger x-ray 3rd # V- negative, no fx, no dislocation, no FB, no STS      <EMERGENCY DEPARTMENT CASE SUMMARY>      Impression/Differential Diagnosis:       1.  Finger pain, left      2.  Vaginal discharge      3.  Possible exposure to STD         4.  Herpes simplex vulvovaginitis         Plan: Acyclovir, Tylenol, ibuprofen, Flagyl, Macrobid, urine culture and GC/chalm pending, epsom soak salts for comfort, will f/u at [facility]  cl[facility] or her OB/GYN for further RPR, HSV 1/2, HIV/AIDS, HEP testing, PAP, HSV referral and information given      ED Course: as documented   Xr 3rd Finger Lt Min 2 V      Result Date: 05/17/2018   LEFT THIRD FINGER: 3 VIEWS HISTORY: Trauma one month ago with persistent pain. There is no fracture, dislocation, subluxation or focal destructive lesion seen.  The bony structures, soft tissues and joint spaces are normal.       IMPRESSION:  Normal study.             Final Impression/Diagnosis:       1.  Finger pain, left      2.  Vaginal discharge      3.  Possible exposure to STD         4.  Herpes simplex vulvovaginitis  Patient condition at time of disposition: stable in good condition         I have reviewed the following home medications:        Prior to Admission medications             Medication  Sig  Start Date  End Date  Taking?  Authorizing Provider            pantoprazole (PROTONIX) 40 mg tablet  Take 40 mg by mouth daily.      Yes  Other, Phys, MD            sertraline (ZOLOFT) 50 mg tablet  Take  by mouth daily.      Yes  Other, Phys, MD              Judith Part, NP      11:31 AM 05/21/2018   CHLAMYDIA / GC-AMPLIFIED [[id number]] (Order [id number])    Lab        Date:  05/17/2018  Department: [facility]  Released By/Authorizing: Judith Part, NP (auto-released)            Component  Value  Flag  Ref Range  Units  Status     Source        ??  Corrected       Cervical/vaginal swab      Comment:     CORRECTED ON 10/22 AT 1151: PREVIOUSLY REPORTED AS URINE,CLEAN            Chlamydia trachomatis, NAA  NEGATIVE      NEGATIVE  ??  Final     Neisseria gonorrhoeae, NAA  NEGATIVE      NEGATIVE  ??  Final       Comment:     (NOTE)   Performed At: [facility]   [address] [place], [place] [id number]   Roda Shutters MD ZH:[id number]             Date: 05/17/2018  Department: [facility]  Released By/Authorizing: Judith Part, NP (auto-released)        Specimen Information:  Clean catch; Urine     ??              Component  Value  Flag  Ref Range  Units  Status     Special Requests:        ??  Final       NO SPECIAL REQUESTS             Culture result:        ??  Final       10,000 to 50,000   COLONIES/mL   THREE OR MORE TYPES OF ORGANISMS ARE PRESENT. ??THIS IS INDICATIVE OF CONTAMINATION DUE TO IMPROPER COLLECTION  TECHNIQUE. ??PLEASE REPEAT COLLECTION UNLESS PATIENT HAS STARTED ANTIBIOTIC TREATMENT.             Culture result:        ??  Final       Test/Panel performed at [facility], [address] [place], [place] [address]      LVM to call [phone] to return call for results, marked reviewed results, pt was began on atb at visit Zithromax, Rocephin, Macrobid, therefore UA collection  not indicated, planned to follow with clinic/OBGYN no further testing or treatment is necessary or indicated on emergency basis.

## 2018-05-17 NOTE — ED Notes (Signed)
States  Her  Left  Middle finger  Is  Aching  From  Old  Injury  And  Also  States  Vaginal  Discharge    z  2  Days

## 2018-05-17 NOTE — ED Notes (Signed)
 States  Left  Middle  Finger  Pain  States  A  Few  Months  Past  She  Slammed  Finger  In  [employer]  Also  States  She  Is  Having  Vaginal  Discharge  For  Past  2  Days   assessment  As  Noted   Pt  To  Xray  At this time   Awaiting  Pt  To  Void  At  This time  And  New  Orders  Physical assessment completed. The patient's level of consciousness is alert. The patient's mood is calm and cooperative. The patient's appearance shows clean with good hygiene. The patient does not  indicate signs or symptoms of abuse or neglect.

## 2018-05-17 NOTE — ED Notes (Signed)
States  Left  Middle  Finger  Pain  States  A  Few  Months  Past  She  Slammed  Finger  In  [employer]  Door  Also  States  She  Is  Having  Vaginal  Discharge  For  Past  2  Days   assessment  As  Noted   Pt  To  Xray  At this time   Awaiting  Pt  To  Void  At  This time  And  New  Orders  Physical assessment completed. The patient???s level of consciousness is alert. The patient???s mood is calm and cooperative. The patient???s appearance shows clean with good hygiene. The patient does not  indicate signs or symptoms of abuse or neglect.

## 2018-05-17 NOTE — ED Provider Notes (Addendum)
HPI Middle finger pain for one month to left hand MIP greatest, smashed in car door, deformed with knot, turned medial according to patient, full ROM, intermittent aching pain with movement, no open area current or at time of accident, vaginal DC for 2 days, found out husband is sleeping with men. Has 2 children age 32 and 30.  IUD, denies pregnancy, G2 P2 A0, denies vaginal odor, increased DC, unaware of color, no hx STD, no hx BV or yeast, c/o severe vaginal burning and itching, dysuria, no back pain, no vaginal pain. No missed or abnormal periods.  No bone or joint DO, no bleeding or coagulation DO, no connective tissue DO. No numbness or tingling, no coolness or color change.  No locking, popping, or crepitus.  Pain worse to finger with movement and palpation. Last pap exam over a year ago.     Past Medical History:   Diagnosis Date   ??? Acid reflux    ??? Anxiety    ??? Bloating    ??? Depression    ??? Depression    ??? Excessive hunger    ??? Gastrointestinal disorder    ??? GERD (gastroesophageal reflux disease)    ??? Headache    ??? Psychiatric disorder     depression   ??? PTSD (post-traumatic stress disorder)    ??? Shortness of breath     when laying down   ??? Stomach pain        Past Surgical History:   Procedure Laterality Date   ??? CHG ANGRPH CATH F-UP STD TCAT OTHER THAN THROMBYLSIS N/A 07/05/2017    ESOPHAGOGASTRODUODENOSCOPY (EGD) BIOPSY performed by Verdie Drown, MD at [facility] MAIN OR   ??? HX GYN      c-section x 2   ??? HX OTHER SURGICAL      cyst removed from right axilla         History reviewed. No pertinent family history.    Social History     Socioeconomic History   ??? Marital status: MARRIED     Spouse name: Not on file   ??? Number of children: Not on file   ??? Years of education: Not on file   ??? Highest education level: Not on file   Occupational History   ??? Not on file   Social Needs   ??? Financial resource strain: Not on file   ??? Food insecurity:     Worry: Not on file     Inability: Not on file    ??? Transportation needs:     Medical: Not on file     Non-medical: Not on file   Tobacco Use   ??? Smoking status: Former Smoker     Packs/day: 0.50   ??? Smokeless tobacco: Never Used   Substance and Sexual Activity   ??? Alcohol use: Yes     Comment: occasional   ??? Drug use: Yes     Types: Marijuana   ??? Sexual activity: Not on file   Lifestyle   ??? Physical activity:     Days per week: Not on file     Minutes per session: Not on file   ??? Stress: Not on file   Relationships   ??? Social connections:     Talks on phone: Not on file     Gets together: Not on file     Attends religious service: Not on file     Active member of club or organization: Not on file  Attends meetings of clubs or organizations: Not on file     Relationship status: Not on file   ??? Intimate partner violence:     Fear of current or ex partner: Not on file     Emotionally abused: Not on file     Physically abused: Not on file     Forced sexual activity: Not on file   Other Topics Concern   ??? Not on file   Social History Narrative   ??? Not on file         ALLERGIES: Clindamycin    Review of Systems   Constitutional: Negative.  Negative for chills and fever.   HENT: Negative.  Negative for congestion, ear pain, rhinorrhea and sore throat.    Eyes: Negative.  Negative for pain and visual disturbance.   Respiratory: Negative.  Negative for cough and shortness of breath.    Cardiovascular: Negative.  Negative for chest pain and palpitations.   Gastrointestinal: Negative.  Negative for abdominal pain and blood in stool.   Genitourinary: Positive for difficulty urinating, dysuria, genital sores and vaginal discharge. Negative for decreased urine volume, dyspareunia, enuresis, flank pain, frequency, hematuria, menstrual problem, pelvic pain, urgency, vaginal bleeding and vaginal pain.   Musculoskeletal: Negative.  Negative for back pain and myalgias.   Skin: Negative.  Negative for color change and rash.    Allergic/Immunologic: Negative.  Negative for immunocompromised state.   Neurological: Negative.  Negative for seizures and headaches.   Hematological: Negative.  Does not bruise/bleed easily.   Psychiatric/Behavioral: Negative.  Negative for dysphoric mood and self-injury.   All other systems reviewed and are negative.      Vitals:    05/17/18 1053   BP: 132/89   Pulse: 80   Resp: 18   Temp: 96.8 ??F (36 ??C)   SpO2: 100%   Weight: 106.6 kg (235 lb)   Height: 5\' 8"  (1.727 m)            Physical Exam   Constitutional: She is oriented to person, place, and time. She appears well-developed and well-nourished. No distress.   HENT:   Head: Normocephalic and atraumatic.   Right Ear: External ear normal.   Left Ear: External ear normal.   Nose: Nose normal.   Mouth/Throat: Oropharynx is clear and moist. No oropharyngeal exudate.   Eyes: Pupils are equal, round, and reactive to light. Conjunctivae and EOM are normal. No scleral icterus.   Neck: Normal range of motion. Neck supple. No tracheal deviation present.   Cardiovascular: Normal rate, regular rhythm, normal heart sounds and intact distal pulses. Exam reveals no gallop and no friction rub.   No murmur heard.  Pulmonary/Chest: Effort normal and breath sounds normal. No stridor. No respiratory distress. She has no wheezes. She has no rales. She exhibits no tenderness.   Abdominal: Soft. Bowel sounds are normal. She exhibits no distension and no mass. There is tenderness. There is no rebound and no guarding. No hernia.   Bladder tenderness with direct palpation, no CVA tenderness, negative Murphy's, negative McBurney's, negative psoas, negative obturator   Genitourinary: Uterus normal.   Genitourinary Comments: Pt with one active vesicular 1/4 cm clear fluid filled lesion to left external labia, right labia with scabbed, crusted lesion and linear abrasions consistent with scratching, no active bleeding  or DC. Mild edema and redness,  Vaginal opening with redness, and yellow green DC, cervix with redness and inflammation with active yellow green DC, no obvious odor, no CMT, no adnexal  tenderness, no tenderness with direct palpation to uterus and ovaries bilateral, GC/Chlam collected, Nancy RN chaperone at bedside for pelvic and bimanual exam   Musculoskeletal: Normal range of motion. She exhibits no edema.   Neurological: She is alert and oriented to person, place, and time.   Skin: Skin is warm and dry. No rash noted. She is not diaphoretic.   Psychiatric: She has a normal mood and affect. Her behavior is normal.   Nursing note and vitals reviewed.       MDM  Number of Diagnoses or Management Options  Finger pain, left: new and requires workup  Herpes simplex vulvovaginitis: new and requires workup  Possible exposure to STD: new and requires workup  Vaginal discharge: new and requires workup         Procedures UPT, UA, reviewed    Urine culture and GC/Chlam     Left finger x-ray 3rd # V- negative, no fx, no dislocation, no FB, no STS    <EMERGENCY DEPARTMENT CASE SUMMARY>    Impression/Differential Diagnosis:   1. Finger pain, left    2. Vaginal discharge    3. Possible exposure to STD    4. Herpes simplex vulvovaginitis      Plan: Acyclovir, Tylenol, ibuprofen, Flagyl, Macrobid, urine culture and GC/chalm pending, epsom soak salts for comfort, will f/u at [facility] clinic or her OB/GYN for further RPR, HSV 1/2, HIV/AIDS, HEP testing, PAP, HSV referral and information given    ED Course: as documented  Xr 3rd Finger Lt Min 2 V    Result Date: 05/17/2018  LEFT THIRD FINGER: 3 VIEWS HISTORY: Trauma one month ago with persistent pain. There is no fracture, dislocation, subluxation or focal destructive lesion seen.  The bony structures, soft tissues and joint spaces are normal.     IMPRESSION:  Normal study.         Final Impression/Diagnosis:   1. Finger pain, left    2. Vaginal discharge    3. Possible exposure to STD     4. Herpes simplex vulvovaginitis          Patient condition at time of disposition: stable in good condition      I have reviewed the following home medications:    Prior to Admission medications    Medication Sig Start Date End Date Taking? Authorizing Provider   pantoprazole (PROTONIX) 40 mg tablet Take 40 mg by mouth daily.   Yes Other, Phys, MD   sertraline (ZOLOFT) 50 mg tablet Take  by mouth daily.   Yes Other, Phys, MD         Judith Part, NP    11:31 AM 05/21/2018  CHLAMYDIA / GC-AMPLIFIED [ZOX0960] (Order [id number])   Lab   Date: 05/17/2018 Department: [facility] Released By/Authorizing: Judith Part, NP (auto-released)   Component Value Flag Ref Range Units Status   Source    ?? Corrected   Cervical/vaginal swab    Comment:   CORRECTED ON 10/22 AT 1151: PREVIOUSLY REPORTED AS URINE,CLEAN   Chlamydia trachomatis, NAA NEGATIVE    NEGATIVE ?? Final   Neisseria gonorrhoeae, NAA NEGATIVE    NEGATIVE ?? Final   Comment:   (NOTE)   Performed At: [facility]   [address] [place], [place] [id number]   Roda Shutters MD ZH:[id number]      Date: 05/17/2018 Department: [facility] Released By/Authorizing: Judith Part, NP (auto-released)   Specimen Information: Clean catch; Urine   ??  Component Value Flag Ref Range Units Status   Special Requests:    ?? Final   NO SPECIAL REQUESTS    Culture result:    ?? Final   10,000 to 50,000   COLONIES/mL   THREE OR MORE TYPES OF ORGANISMS ARE PRESENT. ??THIS IS INDICATIVE OF CONTAMINATION DUE TO IMPROPER COLLECTION TECHNIQUE. ??PLEASE REPEAT COLLECTION UNLESS PATIENT HAS STARTED ANTIBIOTIC TREATMENT.    Culture result:    ?? Final   Test/Panel performed at [facility], [address], [place], [place] [address]    LVM to call [phone] to return call for results, marked reviewed results, pt was began on atb at visit Zithromax, Rocephin, Macrobid, therefore UA collection not indicated, planned to follow with clinic/OBGYN  no further testing or treatment is necessary or indicated on emergency basis.

## 2018-05-17 NOTE — ED Triage Notes (Signed)
States  Her  Left  Middle finger  Is  Aching  From  Old  Injury  And  Also  States  Vaginal  Discharge    z  2  Days

## 2018-05-18 LAB — CHLAMYDIA / GC-AMPLIFIED
CHLAMYDIA TRACHOMATIS, NAA, 188078: NEGATIVE
Chlamydia trachomatis, NAA: NEGATIVE
NEISSERIA GONORRHOEAE, NAA, 188086: NEGATIVE
Neisseria gonorrhoeae, NAA: NEGATIVE

## 2018-05-18 LAB — CULTURE, URINE
Culture result:: 10000
Culture: 10000

## 2023-01-21 IMAGING — CT CT ABD-PELV W/ CM
2 of 4 series · 16 of 46 positions shown, 18 images · IV contrast (Omni 300)
Comparison: None.

CLINICAL DATA: Acute generalized abdominal pain.

EXAM:
CT ABDOMEN AND PELVIS WITH CONTRAST
TECHNIQUE: Multidetector CT imaging of the abdomen and pelvis was performed
using the standard protocol following bolus administration of
intravenous contrast.
CONTRAST:  100mL OMNIPAQUE IOHEXOL 300 MG/ML  SOLN

[Series 3: a/p w/ 5mm · axial · 0.73mm/px · z∈[-1132,-677]mm · 13 of 99 slices shown, 15 images]
[im 4/99  soft-tissue]
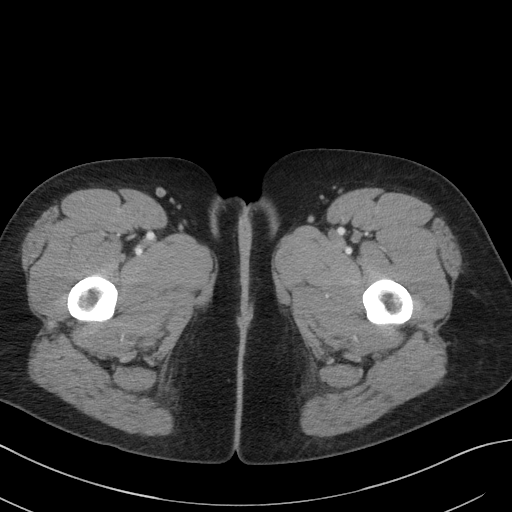
[im 4/99  bone]
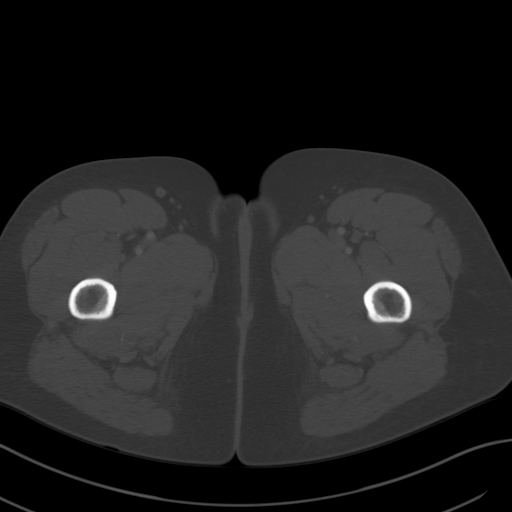
[im 12/99  soft-tissue]
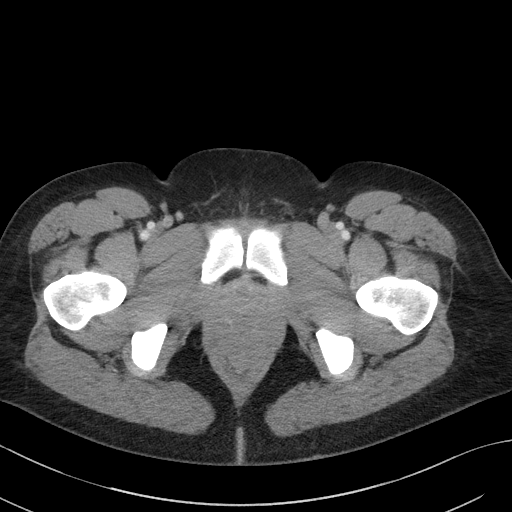
[im 20/99  soft-tissue]
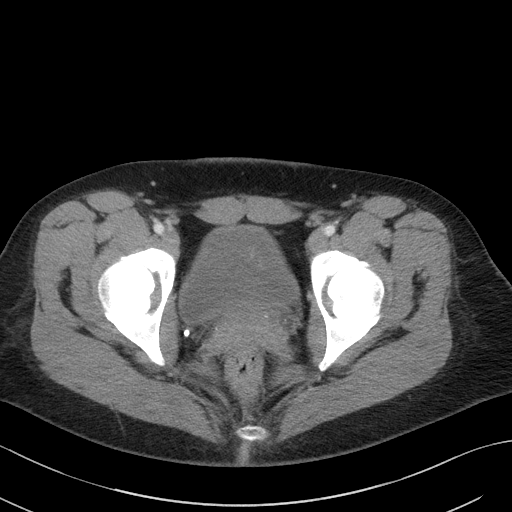
[im 28/99  soft-tissue]
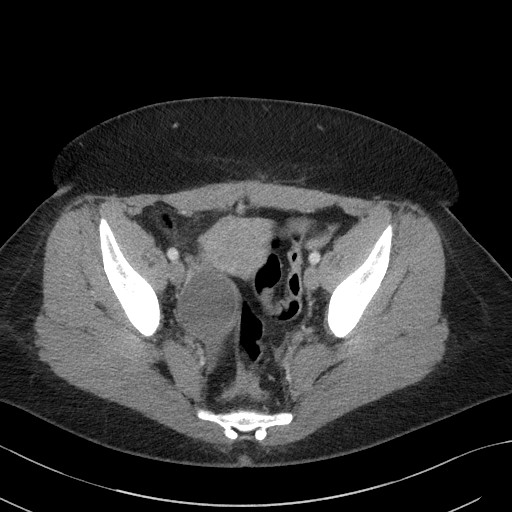
[im 36/99  soft-tissue]
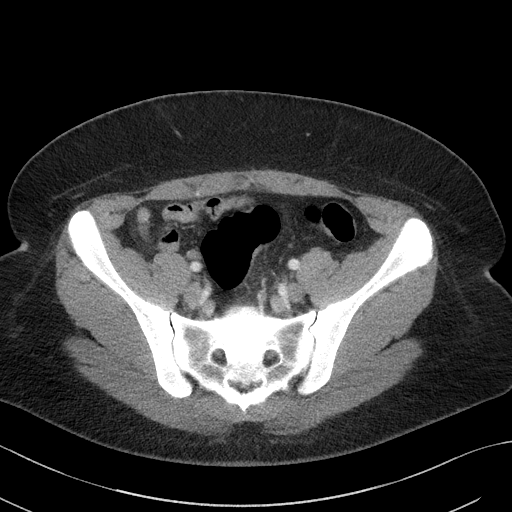
[im 44/99  soft-tissue]
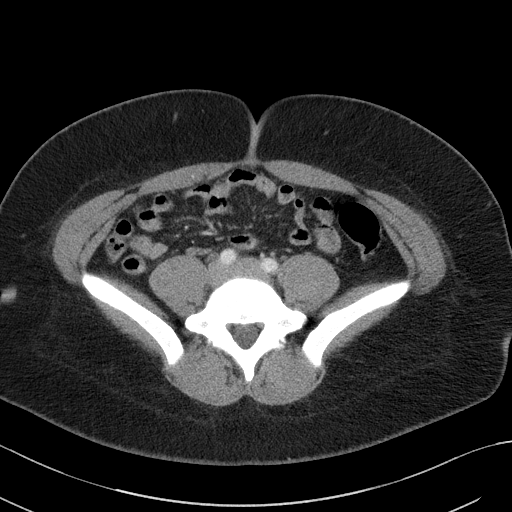
[im 51/99  soft-tissue]
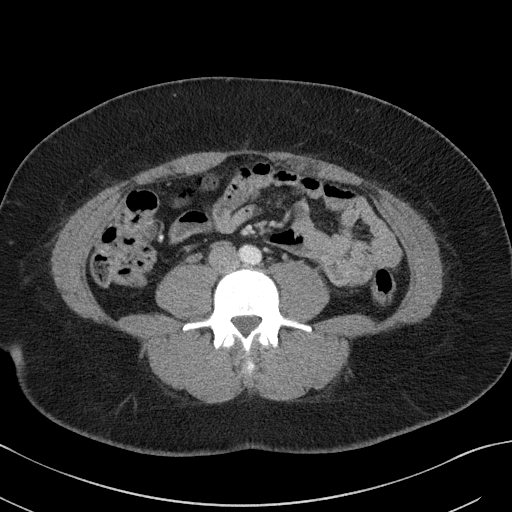
[im 55/99  soft-tissue]
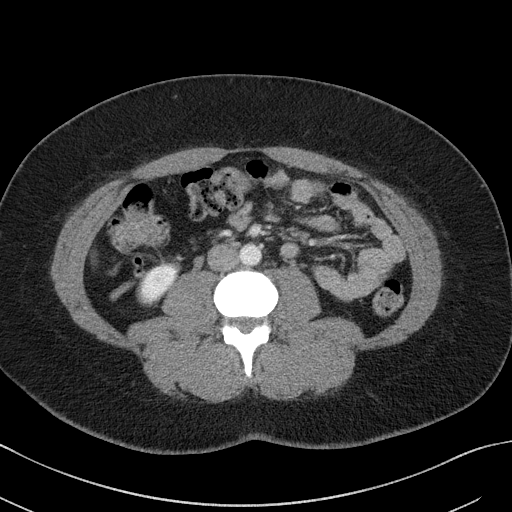
[im 63/99  soft-tissue]
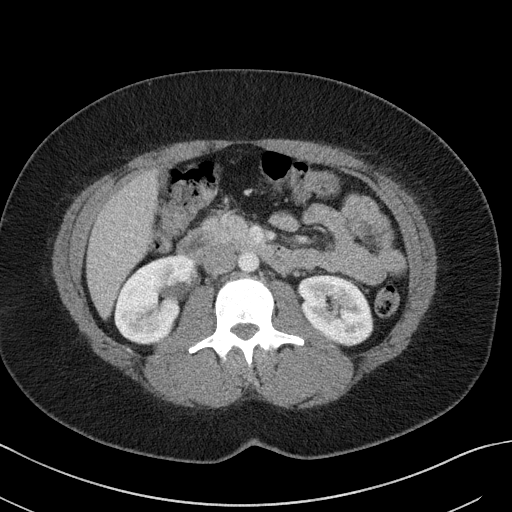
[im 63/99  bone]
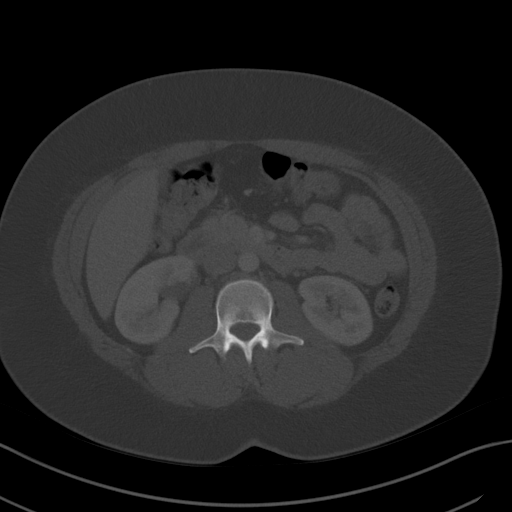
[im 71/99  soft-tissue]
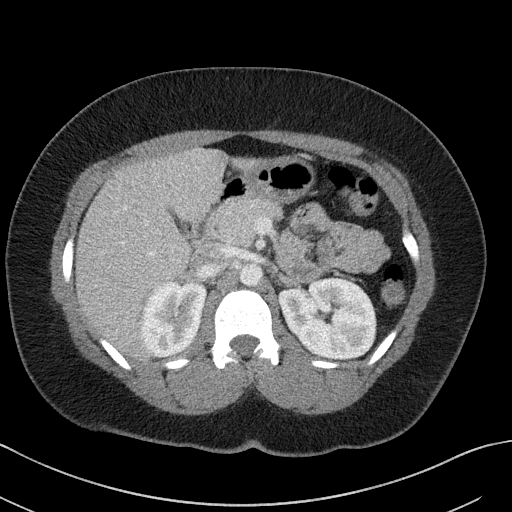
[im 79/99  soft-tissue]
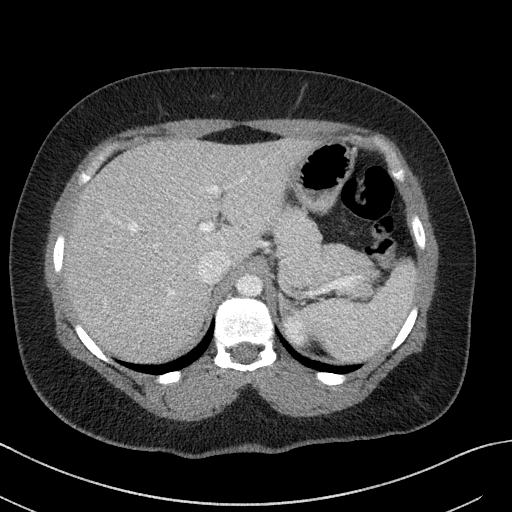
[im 87/99  soft-tissue]
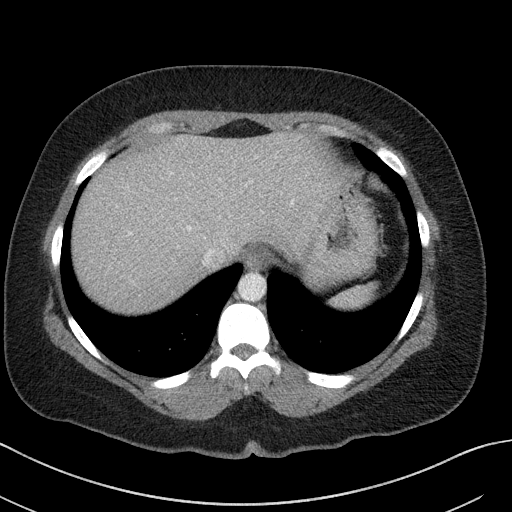
[im 95/99  soft-tissue]
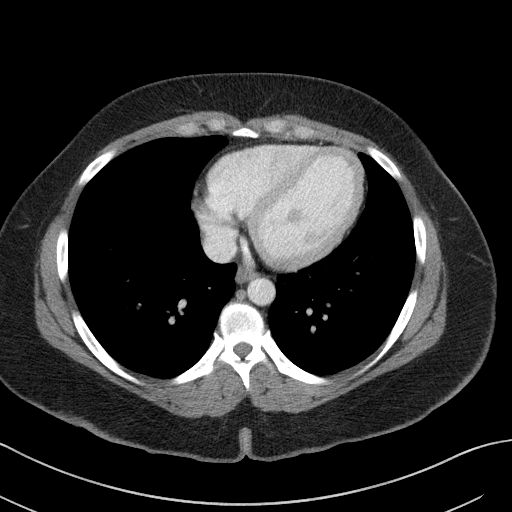

[Series 6: a/p w/ cor · coronal · 0.88mm/px · 3 of 140 slices shown]
[im 47/140  soft-tissue]
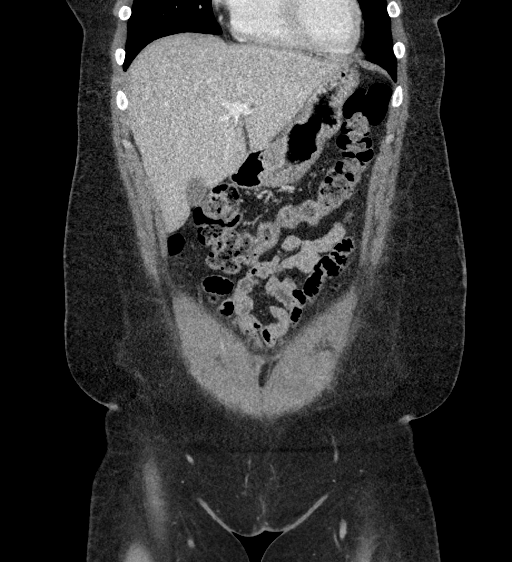
[im 62/140  soft-tissue]
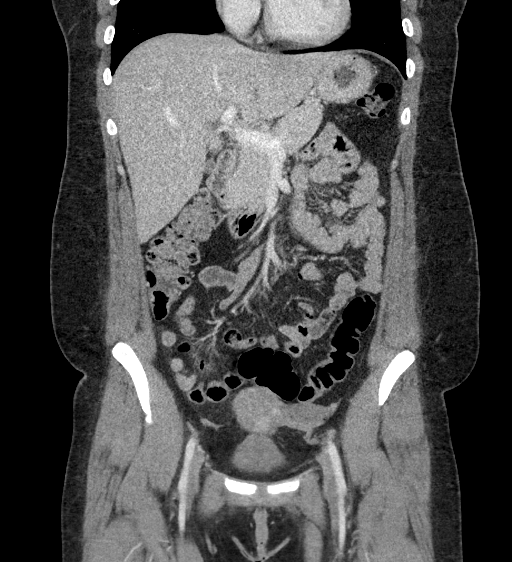
[im 78/140  soft-tissue]
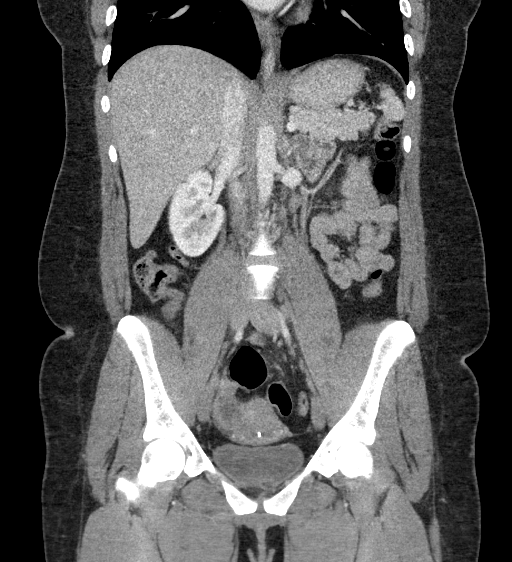

[16 of 46 positions shown; findings below may reference images not displayed]

FINDINGS: Lower chest: No acute abnormality.

Hepatobiliary: No focal liver abnormality is seen. No gallstones,
gallbladder wall thickening, or biliary dilatation.

Pancreas: Unremarkable. No pancreatic ductal dilatation or
surrounding inflammatory changes.

Spleen: Normal in size without focal abnormality.

Adrenals/Urinary Tract: Adrenal glands are unremarkable. Kidneys are
normal, without renal calculi, focal lesion, or hydronephrosis.
Bladder is unremarkable.

Stomach/Bowel: Stomach is within normal limits. Appendix appears
normal. No evidence of bowel wall thickening, distention, or
inflammatory changes.

Vascular/Lymphatic: No significant vascular findings are present. No
enlarged abdominal or pelvic lymph nodes.

Reproductive: Intrauterine device is noted. 4.7 cm cystic
abnormality is seen in right adnexa.

Other: No abdominal wall hernia or abnormality. No abdominopelvic
ascites.

Musculoskeletal: No acute or significant osseous findings.
IMPRESSION: 4.7 cm cystic abnormality is noted in right adnexal region. Because
this lesion is not adequately characterized, prompt US is
recommended for further evaluation. Note: This recommendation does
not apply to premenarchal patients and to those with increased risk
(genetic, family history, elevated tumor markers or other high-risk
factors) of ovarian cancer. Reference: JACR [DATE]):[id number].

No other abnormality seen in the abdomen or pelvis.

## 2023-01-21 IMAGING — US US PELVIS COMPLETE WITH TRANSVAGINAL
1 series · 13 of 25 positions shown · non-contrast
Comparison: CT 02/10/2021

CLINICAL DATA: Cysts seen on CT



[Series 1: us pelvic complete with transvaginal · 54 acquisitions, 13 frames shown]
[im 1/54]
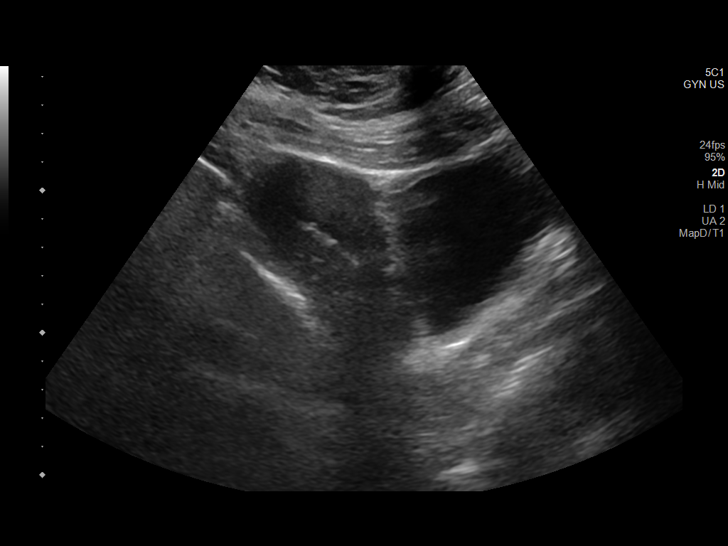
[im 5/54]
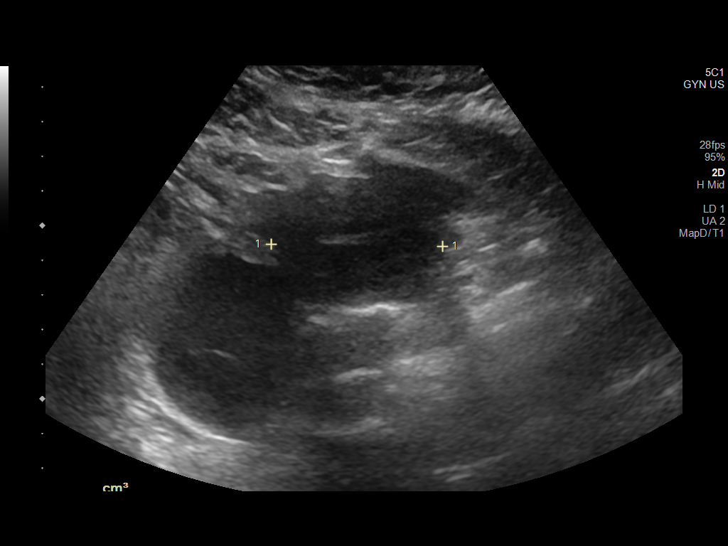
[im 9/54]
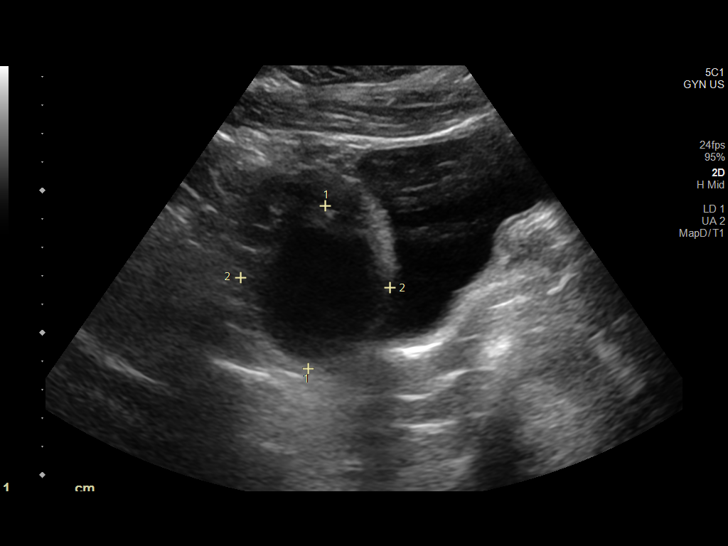
[im 14/54]
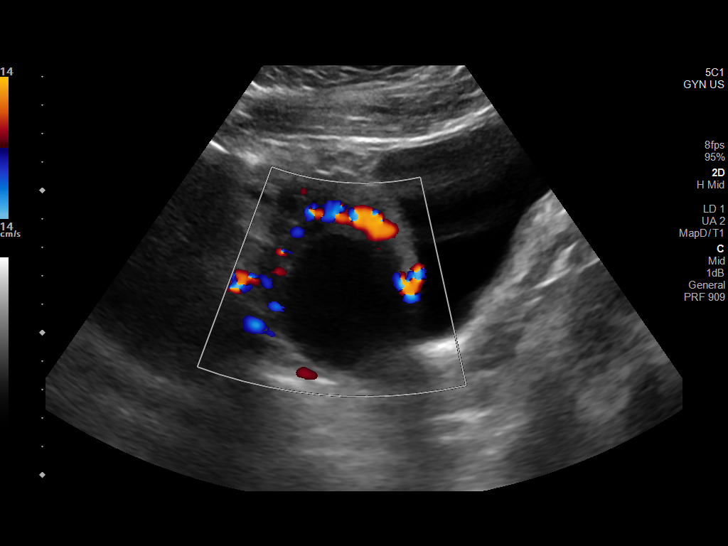
[im 18/54]
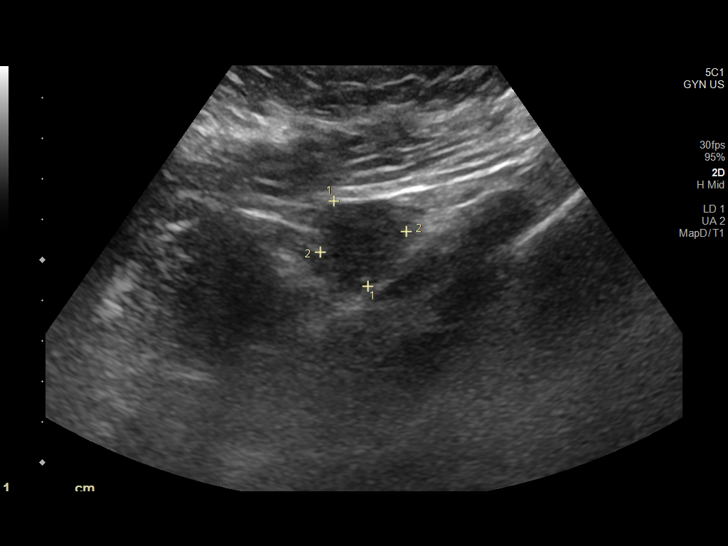
[im 23/54]
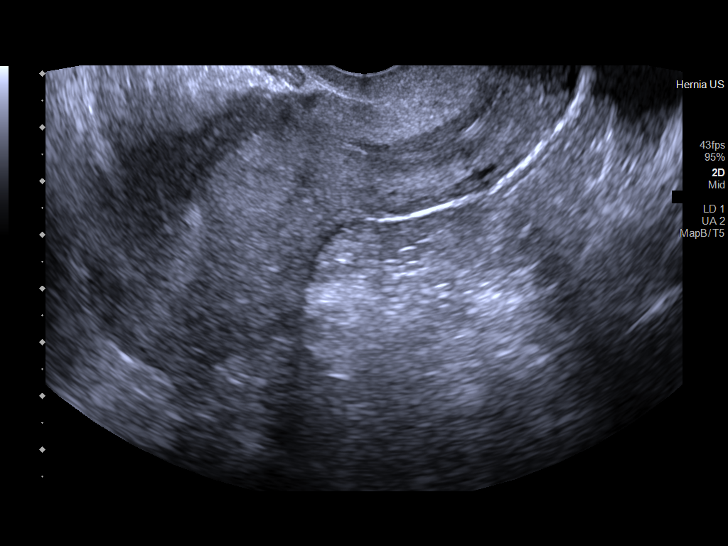
[im 27/54]
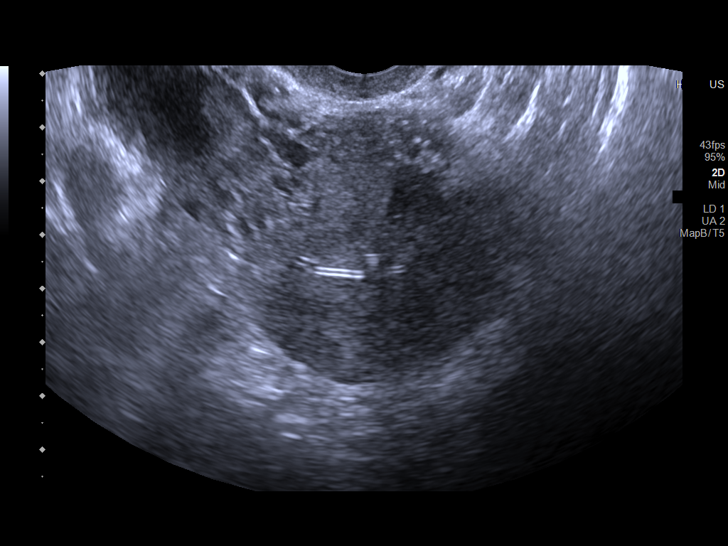
[im 31/54]
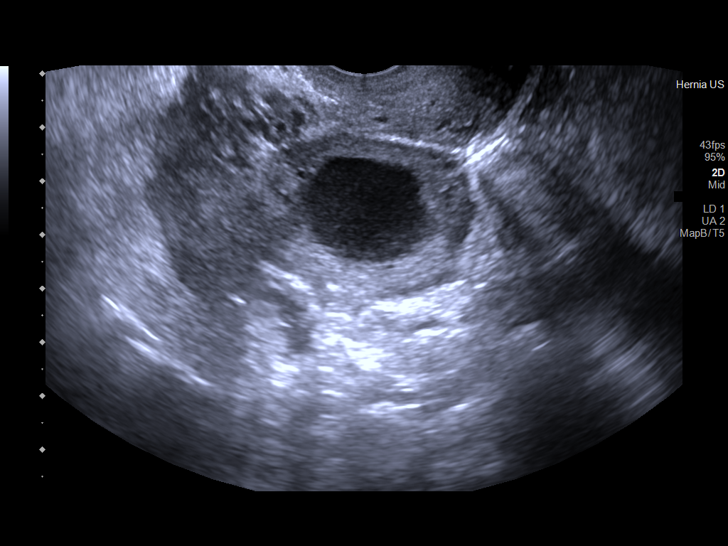
[im 36/54]
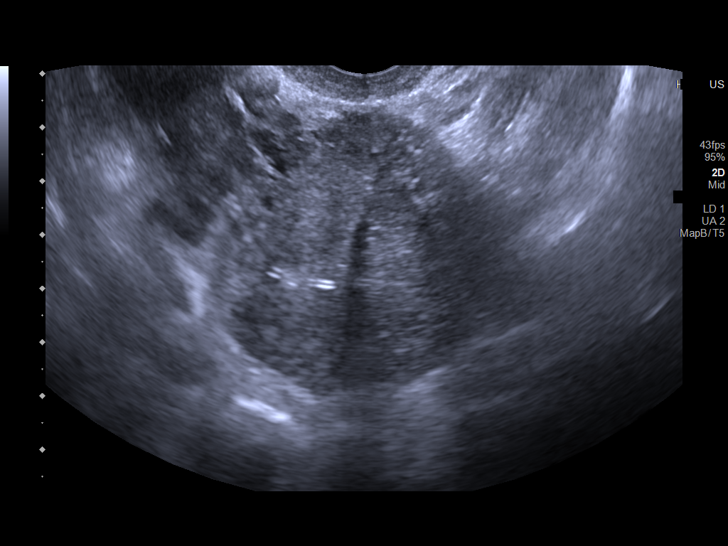
[im 40/54]
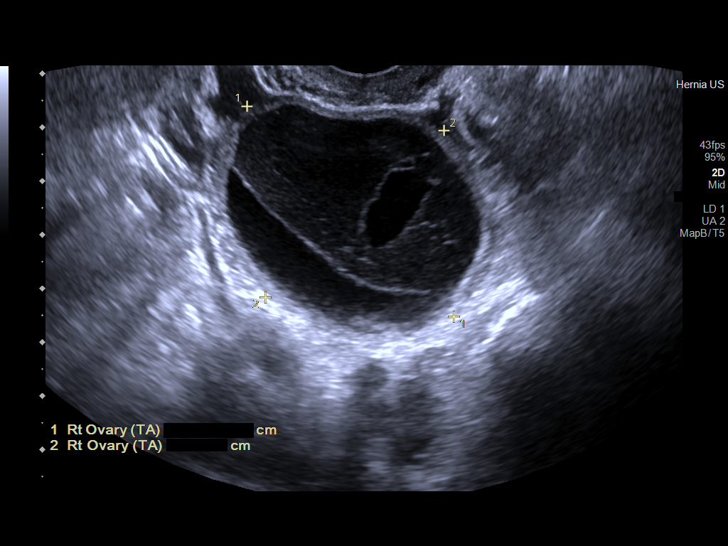
[im 45/54]
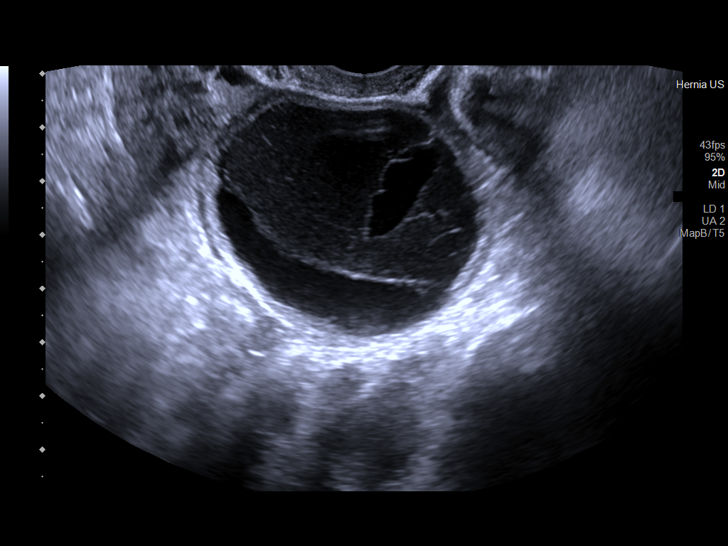
[im 49/54]
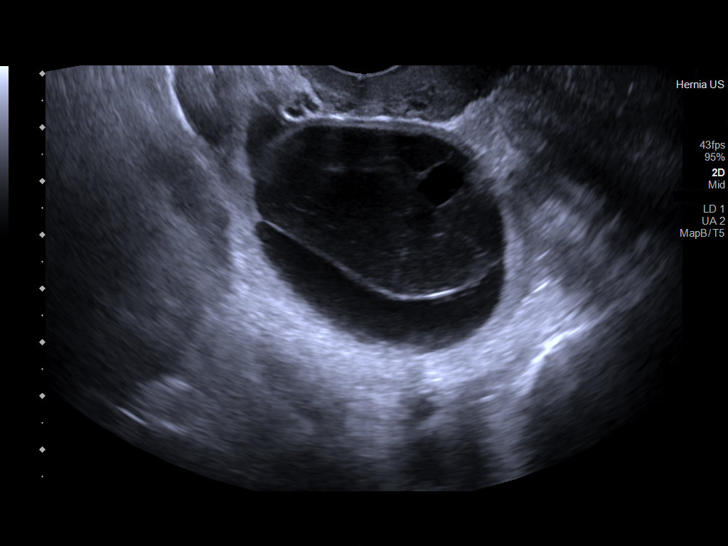
[im 54/54]
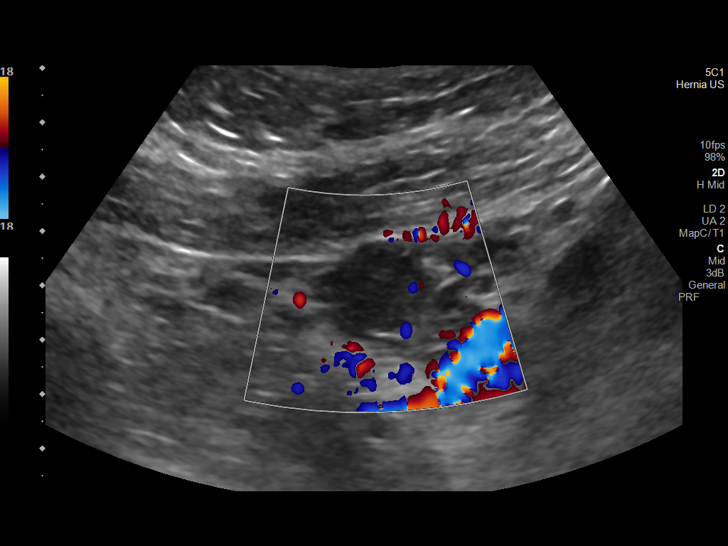

[13 of 25 positions shown; findings below may reference images not displayed]

FINDINGS: Uterus

Measurements: 2.0 x 4.5 x 5.2 cm = volume: 122.3 mL. No fibroids or
other mass visualized.

Endometrium

Thickness: 0.6 mm.  No focal abnormality visualized.

Right ovary

Measurements: 5.5 x 4.6 x 4.7 cm = volume: 62.0 mL. There is a right
ovarian cyst with fine reticular layering echogenic debris and small
anechoic areas. There is no internal vascularity. There is some
peripheral vascularity within the ovarian tissue.

Left ovary

Measurements: 2.3 x 2.2 x 2.0 cm = volume: 5.3 mL. Normal
appearance/no adnexal mass.

Other findings

Small free fluid adjacent to the right ovary.
IMPRESSION: Findings are most consistent with a 5.5 cm rupturing hemorrhagic
cyst in the right ovary. Recommended follow-up ultrasound in 6-12
weeks.
